# Patient Record
Sex: Male | Born: 1990 | Race: Black or African American | Hispanic: No | Marital: Single | Smoking: Never smoker
Health system: Southern US, Community
[De-identification: ages and names within clinical notes are randomized; demographics above are authoritative.]

---

## 2014-03-23 ENCOUNTER — Encounter (HOSPITAL_BASED_OUTPATIENT_CLINIC_OR_DEPARTMENT_OTHER): Payer: Self-pay | Admitting: Emergency Medicine

## 2014-03-23 DIAGNOSIS — N4889 Other specified disorders of penis: Secondary | ICD-10-CM | POA: Insufficient documentation

## 2014-03-23 LAB — URINALYSIS, ROUTINE W REFLEX MICROSCOPIC
BILIRUBIN URINE: NEGATIVE
Glucose, UA: 100 mg/dL — AB
Hgb urine dipstick: NEGATIVE
Ketones, ur: NEGATIVE mg/dL
LEUKOCYTES UA: NEGATIVE
NITRITE: NEGATIVE
PH: 6 (ref 5.0–8.0)
Protein, ur: NEGATIVE mg/dL
SPECIFIC GRAVITY, URINE: 1.025 (ref 1.005–1.030)
Urobilinogen, UA: 1 mg/dL (ref 0.0–1.0)

## 2014-03-23 NOTE — ED Notes (Signed)
Pt c/o painful urination denies discharge

## 2014-03-24 ENCOUNTER — Emergency Department (HOSPITAL_BASED_OUTPATIENT_CLINIC_OR_DEPARTMENT_OTHER)
Admission: EM | Admit: 2014-03-24 | Discharge: 2014-03-24 | Disposition: A | Discharge status: Home / Self Care | Payer: BC Managed Care – PPO | Attending: Emergency Medicine | Admitting: Emergency Medicine

## 2014-03-24 DIAGNOSIS — N4889 Other specified disorders of penis: Secondary | ICD-10-CM

## 2014-03-24 MED ORDER — AZITHROMYCIN 1 G PO PACK
PACK | ORAL | Status: AC
  Filled 2014-03-24: qty 1

## 2014-03-24 MED ORDER — LIDOCAINE HCL (PF) 1 % IJ SOLN
INTRAMUSCULAR | Status: AC
  Filled 2014-03-24: qty 5

## 2014-03-24 MED ORDER — CEFTRIAXONE SODIUM 250 MG IJ SOLR
250.0000 mg | Freq: Once | INTRAMUSCULAR | Status: AC
  Administered 2014-03-24: 250 mg via INTRAMUSCULAR
  Filled 2014-03-24: qty 250

## 2014-03-24 MED ORDER — AZITHROMYCIN 250 MG PO TABS
1000.0000 mg | ORAL_TABLET | Freq: Once | ORAL | Status: AC
  Administered 2014-03-24: 1000 mg via ORAL
  Filled 2014-03-24: qty 4

## 2014-03-24 NOTE — ED Provider Notes (Signed)
CSN: [id number]     Arrival date & time 03/23/14  2319 History  This chart was scribed for Glynn OctaveStephen Shunna Mikaelian, MD by Ardelia Memsylan Malpass, ED Scribe. This patient was seen in room MH09/MH09 and the patient's care was started at 1:00 AM.   Chief Complaint  Patient presents with  . Dysuria    The history is provided by the patient. No language interpreter was used.    HPI Comments: Andrew Padilla is a 23 y.o. male with no chronic medical conditions who presents to the Emergency Department complaining of "burning" dysuria onset 1 week ago. He states that he has also noticed "bumps" on his distal penis over the past 2 weeks. He denies any itching or pain to the bumps. He states that he had testicular pain 1 day last month, and that this resolved. He states that he has not tried any medications for his symptoms. He states that he has had similar symptoms in the past, and that after being treated empirically for chlamydia and gonorrhea, his symptoms resolved. He states that he is sexually active with his fiancee and that he does not use protection. He denies penile discharge, abdominal pain, back pain, testicular pain or any other pain or symptoms.    History reviewed. No pertinent past medical history. History reviewed. No pertinent past surgical history. History reviewed. No pertinent family history. History  Substance Use Topics  . Smoking status: Never Smoker   . Smokeless tobacco: Not on file  . Alcohol Use: No    Review of Systems A complete 10 system review of systems was obtained and all systems are negative except as noted in the HPI and PMH.   Allergies  Review of patient's allergies indicates no known allergies.  Home Medications   Prior to Admission medications   Not on File   Triage Vitals: BP 152/80  Pulse 106  Temp(Src) 98.2 F (36.8 C) (Oral)  Resp 18  Ht 6\' 2"  (1.88 m)  Wt 203 lb (92.08 kg)  BMI 26.05 kg/m2  SpO2 100%  Physical Exam  Nursing note and vitals  reviewed. Constitutional: He is oriented to person, place, and time. He appears well-developed and well-nourished. No distress.  HENT:  Head: Normocephalic and atraumatic.  Mouth/Throat: Oropharynx is clear and moist. No oropharyngeal exudate.  Eyes: Conjunctivae and EOM are normal. Pupils are equal, round, and reactive to light.  Neck: Neck supple. No tracheal deviation present.  Cardiovascular: Normal rate and normal heart sounds.   No murmur heard. Pulmonary/Chest: Effort normal. No respiratory distress.  Abdominal: Soft. There is no tenderness. There is no rebound and no guarding.  Genitourinary: No penile tenderness.  Testicles non-tender. Many flesh-colored papules covering glans and shaft. No warts. No open wounds. No vesicles.  Musculoskeletal: Normal range of motion. He exhibits no edema and no tenderness.  Neurological: He is alert and oriented to person, place, and time. No cranial nerve deficit. He exhibits normal muscle tone. Coordination normal.  Skin: Skin is warm and dry.  Psychiatric: He has a normal mood and affect. His behavior is normal.    ED Course  Procedures (including critical care time)  DIAGNOSTIC STUDIES: Oxygen Saturation is 100% on RA, normal by my interpretation.    COORDINATION OF CARE: 1:06 AM- Will order Rocephin and Zithromax. Discussed normal UA findings. Pt advised of plan for treatment and pt agrees.  Labs Review Labs Reviewed  URINALYSIS, ROUTINE W REFLEX MICROSCOPIC - Abnormal; Notable for the following:    Glucose, UA 100 (*)  All other components within normal limits   Imaging Review No results found.   EKG Interpretation None      MDM   Final diagnoses:  Penile papules   Burning with urination associated with painless papules to penile shaft and glans. No testicular pain. No discharge. No abdominal pain, fever vomiting.  Exam benign. No testicular tenderness. No vesicles or warts. UA negative.  Will treat empirically for  GC and chlamydia. No evidence of genital warts. Suspect pearly penile papules versus molluscum contagiosum. Discussed benign etiology of condition with patient. Will refer to dermatology. Safe sex practices d/w patient and fiancee.   I personally performed the services described in this documentation, which was scribed in my presence. The recorded information has been reviewed and is accurate.   Glynn OctaveStephen Giovannina Mun, MD 03/24/14 [phone]

## 2014-03-24 NOTE — Discharge Instructions (Signed)
Rash Follow up with the dermatologist. Return to the ED if you develop new or worsening sym[ptoms. A rash is a change in the color or texture of your skin. There are many different types of rashes. You may have other problems that accompany your rash. CAUSES   Infections.  Allergic reactions. This can include allergies to pets or foods.  Certain medicines.  Exposure to certain chemicals, soaps, or cosmetics.  Heat.  Exposure to poisonous plants.  Tumors, both cancerous and noncancerous. SYMPTOMS   Redness.  Scaly skin.  Itchy skin.  Dry or cracked skin.  Bumps.  Blisters.  Pain. DIAGNOSIS  Your caregiver may do a physical exam to determine what type of rash you have. A skin sample (biopsy) may be taken and examined under a microscope. TREATMENT  Treatment depends on the type of rash you have. Your caregiver may prescribe certain medicines. For serious conditions, you may need to see a skin doctor (dermatologist). HOME CARE INSTRUCTIONS   Avoid the substance that caused your rash.  Do not scratch your rash. This can cause infection.  You may take cool baths to help stop itching.  Only take over-the-counter or prescription medicines as directed by your caregiver.  Keep all follow-up appointments as directed by your caregiver. SEEK IMMEDIATE MEDICAL CARE IF:  You have increasing pain, swelling, or redness.  You have a fever.  You have new or severe symptoms.  You have body aches, diarrhea, or vomiting.  Your rash is not better after 3 days. MAKE SURE YOU:  Understand these instructions.  Will watch your condition.  Will get help right away if you are not doing well or get worse. Document Released: 10/27/2002 Document Revised: 01/29/2012 Document Reviewed: 08/21/2011 [facility] Patient Information 2014 [place], [place].

## 2014-06-27 ENCOUNTER — Encounter (HOSPITAL_BASED_OUTPATIENT_CLINIC_OR_DEPARTMENT_OTHER): Payer: Self-pay | Admitting: Emergency Medicine

## 2014-06-27 ENCOUNTER — Emergency Department (HOSPITAL_BASED_OUTPATIENT_CLINIC_OR_DEPARTMENT_OTHER)
Admission: EM | Admit: 2014-06-27 | Discharge: 2014-06-28 | Disposition: A | Discharge status: Home / Self Care | Payer: BC Managed Care – PPO | Attending: Emergency Medicine | Admitting: Emergency Medicine

## 2014-06-27 DIAGNOSIS — N4889 Other specified disorders of penis: Secondary | ICD-10-CM

## 2014-06-27 DIAGNOSIS — R300 Dysuria: Secondary | ICD-10-CM

## 2014-06-27 DIAGNOSIS — R21 Rash and other nonspecific skin eruption: Secondary | ICD-10-CM | POA: Insufficient documentation

## 2014-06-27 DIAGNOSIS — N410 Acute prostatitis: Secondary | ICD-10-CM

## 2014-06-27 LAB — URINALYSIS, ROUTINE W REFLEX MICROSCOPIC
Bilirubin Urine: NEGATIVE
Glucose, UA: NEGATIVE mg/dL
Hgb urine dipstick: NEGATIVE
Ketones, ur: NEGATIVE mg/dL
Leukocytes, UA: NEGATIVE
Nitrite: NEGATIVE
Protein, ur: NEGATIVE mg/dL
Specific Gravity, Urine: 1.019 (ref 1.005–1.030)
Urobilinogen, UA: 1 mg/dL (ref 0.0–1.0)
pH: 7.5 (ref 5.0–8.0)

## 2014-06-27 MED ORDER — CIPROFLOXACIN HCL 500 MG PO TABS: 500.0000 mg | ORAL_TABLET | Freq: Two times a day (BID) | ORAL | Status: DC

## 2014-06-27 MED ORDER — CIPROFLOXACIN HCL 500 MG PO TABS
500.0000 mg | ORAL_TABLET | Freq: Two times a day (BID) | ORAL | Status: DC
  Administered 2014-06-27: 500 mg via ORAL
  Filled 2014-06-27: qty 1

## 2014-06-27 NOTE — Discharge Instructions (Signed)
Dysuria Dysuria is the medical term for pain with urination. There are many causes for dysuria, but urinary tract infection is the most common. If a urinalysis was performed it can show that there is a urinary tract infection. A urine culture confirms that you or your child is sick. You will need to follow up with a healthcare provider because:  If a urine culture was done you will need to know the culture results and treatment recommendations.  If the urine culture was positive, you or your child will need to be put on antibiotics or know if the antibiotics prescribed are the right antibiotics for your urinary tract infection.  If the urine culture is negative (no urinary tract infection), then other causes may need to be explored or antibiotics need to be stopped. Today laboratory work may have been done and there does not seem to be an infection. If cultures were done they will take at least 24 to 48 hours to be completed. Today x-rays may have been taken and they read as normal. No cause can be found for the problems. The x-rays may be re-read by a radiologist and you will be contacted if additional findings are made. You or your child may have been put on medications to help with this problem until you can see your primary caregiver. If the problems get better, see your primary caregiver if the problems return. If you were given antibiotics (medications which kill germs), take all of the mediations as directed for the full course of treatment.  If laboratory work was done, you need to find the results. Leave a telephone number where you can be reached. If this is not possible, make sure you find out how you are to get test results. HOME CARE INSTRUCTIONS   Drink lots of fluids. For adults, drink eight, 8 ounce glasses of clear juice or water a day. For children, replace fluids as suggested by your caregiver.  Empty the bladder often. Avoid holding urine for long periods of time.  After a bowel  movement, women should cleanse front to back, using each tissue only once.  Empty your bladder before and after sexual intercourse.  Take all the medicine given to you until it is gone. You may feel better in a few days, but TAKE ALL MEDICINE.  Avoid caffeine, tea, alcohol and carbonated beverages, because they tend to irritate the bladder.  In men, alcohol may irritate the prostate.  Only take over-the-counter or prescription medicines for pain, discomfort, or fever as directed by your caregiver.  If your caregiver has given you a follow-up appointment, it is very important to keep that appointment. Not keeping the appointment could result in a chronic or permanent injury, pain, and disability. If there is any problem keeping the appointment, you must call back to this facility for assistance. SEEK IMMEDIATE MEDICAL CARE IF:   Back pain develops.  A fever develops.  There is nausea (feeling sick to your stomach) or vomiting (throwing up).  Problems are no better with medications or are getting worse. MAKE SURE YOU:   Understand these instructions.  Will watch your condition.  Will get help right away if you are not doing well or get worse. Document Released: 08/04/2004 Document Revised: 01/29/2012 Document Reviewed: 06/11/2008 [facility] Patient Information 2015 [place], [place]. This information is not intended to replace advice given to you by your health care provider. Make sure you discuss any questions you have with your health care provider.  Prostatitis The prostate  gland is about the size and shape of a walnut. It is located just below your bladder. It produces one of the components of semen, which is made up of sperm and the fluids that help nourish and transport it out from the testicles. Prostatitis is inflammation of the prostate gland.  There are four types of prostatitis:  Acute bacterial prostatitis. This is the least common type of prostatitis. It starts quickly and  usually is associated with a bladder infection, high fever, and shaking chills. It can occur at any age.  Chronic bacterial prostatitis. This is a persistent bacterial infection in the prostate. It usually develops from repeated acute bacterial prostatitis or acute bacterial prostatitis that was not properly treated. It can occur in men of any age but is most common in middle-aged men whose prostate has begun to enlarge. The symptoms are not as severe as those in acute bacterial prostatitis. Discomfort in the part of your body that is in front of your rectum and below your scrotum (perineum), lower abdomen, or in the head of your penis (glans) may represent your primary discomfort.  Chronic prostatitis (nonbacterial). This is the most common type of prostatitis. It is inflammation of the prostate gland that is not caused by a bacterial infection. The cause is unknown and may be associated with a viral infection or autoimmune disorder.  Prostatodynia (pelvic floor disorder). This is associated with increased muscular tone in the pelvis surrounding the prostate. CAUSES The causes of bacterial prostatitis are bacterial infection. The causes of the other types of prostatitis are unknown.  SYMPTOMS  Symptoms can vary depending upon the type of prostatitis that exists. There can also be overlap in symptoms. Possible symptoms for each type of prostatitis are listed below. Acute Bacterial Prostatitis  Painful urination.  Fever or chills.  Muscle or joint pains.  Low back pain.  Low abdominal pain.  Inability to empty bladder completely. Chronic Bacterial Prostatitis, Chronic Nonbacterial Prostatitis, and Prostatodynia  Sudden urge to urinate.  Frequent urination.  Difficulty starting urine stream.  Weak urine stream.  Discharge from the urethra.  Dribbling after urination.  Rectal pain.  Pain in the testicles, penis, or tip of the penis.  Pain in the perineum.  Problems with  sexual function.  Painful ejaculation.  Bloody semen. DIAGNOSIS  In order to diagnose prostatitis, your health care provider will ask about your symptoms. One or more urine samples will be taken and tested (urinalysis). If the urinalysis result is negative for bacteria, your health care provider may use a finger to feel your prostate (digital rectal exam). This exam helps your health care provider determine if your prostate is swollen and tender. It will also produce a specimen of semen that can be analyzed. TREATMENT  Treatment for prostatitis depends on the cause. If a bacterial infection is the cause, it can be treated with antibiotic medicine. In cases of chronic bacterial prostatitis, the use of antibiotics for up to 1 month or 6 weeks may be necessary. Your health care provider may instruct you to take sitz baths to help relieve pain. A sitz bath is a bath of hot water in which your hips and buttocks are under water. This relaxes the pelvic floor muscles and often helps to relieve the pressure on your prostate. HOME CARE INSTRUCTIONS   Take all medicines as directed by your health care provider.  Take sitz baths as directed by your health care provider. SEEK MEDICAL CARE IF:   Your symptoms get worse,  not better.  You have a fever. SEEK IMMEDIATE MEDICAL CARE IF:   You have chills.  You feel nauseous or vomit.  You feel lightheaded or faint.  You are unable to urinate.  You have blood or blood clots in your urine. MAKE SURE YOU:  Understand these instructions.  Will watch your condition.  Will get help right away if you are not doing well or get worse. Document Released: 11/03/2000 Document Revised: 11/11/2013 Document Reviewed: 05/26/2013 [facility] Patient Information 2015 [place], [place]. This information is not intended to replace advice given to you by your health care provider. Make sure you discuss any questions you have with your health care provider.   Molluscum  Contagiosum Molluscum contagiosum is a viral infection of the skin that causes smooth surfaced, firm, small (3 to 5 mm), dome-shaped bumps (papules) which are flesh-colored. The bumps usually do not hurt or itch. In children, they most often appear on the face, trunk, arms and legs. In adults, the growths are commonly found on the genitals, thighs, face, neck, and belly (abdomen). The infection may be spread to others by close (skin to skin) contact (such as occurs in schools and swimming pools), sharing towels and clothing, and through sexual contact. The bumps usually disappear without treatment in 2 to 4 months, especially in children. You may have them treated to avoid spreading them. Scraping (curetting) the middle part (central plug) of the bump with a needle or sharp curette, or application of liquid nitrogen for 8 or 9 seconds usually cures the infection. HOME CARE INSTRUCTIONS   Do not scratch the bumps. This may spread the infection to other parts of the body and to other people.  Avoid close contact with others, including sexual contact, until the bumps disappear. Do not share towels or clothing.  If liquid nitrogen was used, blisters will form. Leave the blisters alone and cover with a bandage. The tops will fall off by themselves in 7 to 14 days.  Four months without a lesion is usually a cure. SEEK IMMEDIATE MEDICAL CARE IF:  You have a fever.  You develop swelling, redness, pain, tenderness, or warmth in the areas of the bumps. They may be infected. Document Released: 11/03/2000 Document Revised: 01/29/2012 Document Reviewed: 04/16/2009 [facility] Patient Information 2015 [place], [place]. This information is not intended to replace advice given to you by your health care provider. Make sure you discuss any questions you have with your health care provider.

## 2014-06-27 NOTE — ED Notes (Addendum)
Pt reports off and on bladder pressure and frequency for one month; pt also has a rash on his penis.

## 2014-06-27 NOTE — ED Provider Notes (Signed)
CSN: [id number]     Arrival date & time 06/27/14  1940 History  This chart was scribed for Andrew Cookey, MD by Elon Spanner, ED Scribe. This patient was seen in room MH10/MH10 and the patient's care was started at 9:05 PM.    Chief Complaint  Patient presents with  . Dysuria  . Rash    Patient is a 23 y.o. male presenting with dysuria and rash. The history is provided by the patient. No language interpreter was used.  Dysuria This is a new problem. The current episode started yesterday. The problem occurs constantly. Associated symptoms include abdominal pain. Pertinent negatives include no chest pain, no headaches and no shortness of breath. Nothing aggravates the symptoms. Nothing relieves the symptoms. He has tried nothing for the symptoms.  Rash Location:  Ano-genital Ano-genital rash location:  Penis Severity:  Mild Onset quality:  Gradual Duration:  3 months Timing:  Constant Progression:  Unchanged Chronicity:  Chronic Worsened by:  Nothing tried Associated symptoms: abdominal pain, diarrhea and fever   Associated symptoms: no fatigue, no headaches, no nausea, no shortness of breath and not vomiting     HPI Comments: Andrew Padilla is a 23 y.o. male who presents to the Emergency Department complaining of moderate dysuria that he has experienced for approximately 1 month.  He reports associated increases in urinary frequency and increased urgency but denies difficulty emptying his bladder fully.  Per chart review, the patient was seen on 5/5 for a complaint of "burning" dysuria and diagnosed with penile papules and treated empirically for GC and chlamydia.  The patient also states he has had associated abdominal pain onset last night.  He states that the severity of the pain oscillates but that he constantly notices a sensation of "bloating".  Patient states that food and fluids do not exacerbate the pain but water intake increases his sensation of bloating.  Patient states had an  episode of diarrhea approximately 1 week ago.  He states that he took a laxative earlier today.  Patient states that he has had a subjective fever within the last several days that he describes as "heat around his ears".  Patient denies new sexual partners and states that he uses a condom sometimes.   Patient denies hematuria, nausea, vomiting, painful BM, sick contacts, penile discharge.  Patient denies a history of bladder infection, kidney stone.     Took laxative today History reviewed. No pertinent past medical history. History reviewed. No pertinent past surgical history. History reviewed. No pertinent family history. History  Substance Use Topics  . Smoking status: Never Smoker   . Smokeless tobacco: Not on file  . Alcohol Use: No    Review of Systems  Constitutional: Positive for fever. Negative for activity change, appetite change and fatigue.  HENT: Negative for congestion, facial swelling, rhinorrhea and trouble swallowing.   Eyes: Negative for photophobia and pain.  Respiratory: Negative for cough, chest tightness and shortness of breath.   Cardiovascular: Negative for chest pain and leg swelling.  Gastrointestinal: Positive for abdominal pain, diarrhea and abdominal distention. Negative for nausea, vomiting and constipation.  Endocrine: Negative for polydipsia and polyuria.  Genitourinary: Positive for dysuria, urgency and frequency. Negative for hematuria, decreased urine volume, discharge and difficulty urinating.  Musculoskeletal: Negative for back pain and gait problem.  Skin: Positive for rash. Negative for color change and wound.  Allergic/Immunologic: Negative for immunocompromised state.  Neurological: Negative for dizziness, facial asymmetry, speech difficulty, weakness, numbness and headaches.  Psychiatric/Behavioral: Negative for  confusion, decreased concentration and agitation.      Allergies  Review of patient's allergies indicates no known allergies.  Home  Medications   Prior to Admission medications   Medication Sig Start Date End Date Taking? Authorizing Provider  ciprofloxacin (CIPRO) 500 MG tablet Take 1 tablet (500 mg total) by mouth 2 (two) times daily. One po bid x 21 days 06/27/14   Andrew CookeyMegan Firman Petrow, MD   BP 155/65  Pulse 92  Temp(Src) 98.8 F (37.1 C) (Oral)  Resp 16  Wt 205 lb (92.987 kg)  SpO2 100% Physical Exam  Nursing note and vitals reviewed. Constitutional: He is oriented to person, place, and time. He appears well-developed and well-nourished. No distress.  HENT:  Head: Normocephalic and atraumatic.  Mouth/Throat: No oropharyngeal exudate.  Eyes: Pupils are equal, round, and reactive to light.  Neck: Normal range of motion. Neck supple.  Cardiovascular: Normal rate, regular rhythm and normal heart sounds.  Exam reveals no gallop and no friction rub.   No murmur heard. Pulmonary/Chest: Effort normal and breath sounds normal. No respiratory distress. He has no wheezes. He has no rales.  Abdominal: Soft. Bowel sounds are normal. He exhibits no distension and no mass. There is no tenderness. There is no rebound and no guarding.  Genitourinary:  Prostate non-tender.  Multiple raised, white, non-tender papules on penile shaft and glans.  Musculoskeletal: Normal range of motion. He exhibits no edema and no tenderness.  Neurological: He is alert and oriented to person, place, and time.  Skin: Skin is warm and dry.  Psychiatric: He has a normal mood and affect.    ED Course  Procedures (including critical care time)  DIAGNOSTIC STUDIES: Oxygen Saturation is 100% on RA, normal by my interpretation.    COORDINATION OF CARE:  9:11 PM Discussed plan to order labs and perform rectal exam.  Patient acknowledges and agrees with plan.    Labs Review Labs Reviewed  URINE CULTURE  GC/CHLAMYDIA PROBE AMP  URINALYSIS, ROUTINE W REFLEX MICROSCOPIC    Imaging Review No results found.   EKG Interpretation None      MDM    Final diagnoses:  Dysuria  Acute prostatitis  Pearly penile papules    Pt is a 23 y.o. male with Pmhx as above who presents with recurrent dysuria, frequency and bladder pressure. Denies fever, painful BMs, penile d/c, hematuria. He also reports "rash" on penis for "months" which is non-tenderness and unchanged. He has had some abdominal cramping today after taking a laxative. He was treated for GC/Chlam at last ED visit. On PE, VSS, pt in NAD. +suprapubic ttp. Prostate exam and UA nml; however, given continued urinary symptoms, will treat presumtively for prostatitis with 3 weeks of cipro.  "Rash" is c/w either pearly penile papules or molluscum contagiosum, in agreement with last exam by Dr. Manus Gunningancour. He will again be asked to f/u with derm. If urinary symptoms continue, I have rec he see a urologist. Return precautions given for new or worsening symptoms including worsening pain, fever, inability to tolerate liquids.       I personally performed the services described in this documentation, which was scribed in my presence. The recorded information has been reviewed and is accurate.     Andrew CookeyMegan Tynslee Bowlds, MD 06/28/14 [phone]

## 2014-06-29 LAB — URINE CULTURE
Colony Count: NO GROWTH
Culture: NO GROWTH

## 2014-06-30 LAB — GC/CHLAMYDIA PROBE AMP
CT Probe RNA: NEGATIVE
GC PROBE AMP APTIMA: NEGATIVE

## 2014-07-02 ENCOUNTER — Emergency Department (HOSPITAL_BASED_OUTPATIENT_CLINIC_OR_DEPARTMENT_OTHER)
Admission: EM | Admit: 2014-07-02 | Discharge: 2014-07-03 | Disposition: A | Discharge status: Home / Self Care | Payer: BC Managed Care – PPO | Attending: Emergency Medicine | Admitting: Emergency Medicine

## 2014-07-02 ENCOUNTER — Encounter (HOSPITAL_BASED_OUTPATIENT_CLINIC_OR_DEPARTMENT_OTHER): Payer: Self-pay | Admitting: Emergency Medicine

## 2014-07-02 DIAGNOSIS — R300 Dysuria: Secondary | ICD-10-CM | POA: Insufficient documentation

## 2014-07-02 DIAGNOSIS — N489 Disorder of penis, unspecified: Secondary | ICD-10-CM | POA: Insufficient documentation

## 2014-07-02 DIAGNOSIS — Z792 Long term (current) use of antibiotics: Secondary | ICD-10-CM | POA: Insufficient documentation

## 2014-07-02 NOTE — ED Notes (Signed)
Pt c/o penile pain and burning when he urinates. Pt seen here recently for the same complaint.

## 2014-07-03 LAB — URINALYSIS, ROUTINE W REFLEX MICROSCOPIC
Bilirubin Urine: NEGATIVE
Glucose, UA: NEGATIVE mg/dL
Hgb urine dipstick: NEGATIVE
Ketones, ur: NEGATIVE mg/dL
LEUKOCYTES UA: NEGATIVE
NITRITE: NEGATIVE
Protein, ur: NEGATIVE mg/dL
Specific Gravity, Urine: 1.035 — ABNORMAL HIGH (ref 1.005–1.030)
Urobilinogen, UA: 1 mg/dL (ref 0.0–1.0)
pH: 6 (ref 5.0–8.0)

## 2014-07-03 MED ORDER — AZITHROMYCIN 250 MG PO TABS
1000.0000 mg | ORAL_TABLET | Freq: Once | ORAL | Status: DC
  Filled 2014-07-03: qty 4

## 2014-07-03 MED ORDER — AZITHROMYCIN 1 G PO PACK
1.0000 g | PACK | Freq: Once | ORAL | Status: AC
  Administered 2014-07-03: 1 g via ORAL
  Filled 2014-07-03: qty 1

## 2014-07-03 MED ORDER — CEFTRIAXONE SODIUM 250 MG IJ SOLR
250.0000 mg | Freq: Once | INTRAMUSCULAR | Status: AC
  Administered 2014-07-03: 250 mg via INTRAMUSCULAR
  Filled 2014-07-03: qty 250

## 2014-07-03 NOTE — ED Provider Notes (Signed)
CSN: [id number]     Arrival date & time 07/02/14  2311 History   First MD Initiated Contact with Patient 07/03/14 0023     Chief Complaint  Patient presents with  . Penis Pain     (Consider location/radiation/quality/duration/timing/severity/associated sxs/prior Treatment) HPI Comments: Patient is a 23 year old male who presents with complaints of dysuria. He states that it burns and the tip of his penis when he urinates. He was seen 2 months ago and diagnosed with an STD. He was treated with antibiotics. He was seen here 5 days ago for similar complaints. His GC and Chlamydia was negative and he was diagnosed with prostatitis. He is currently taking Cipro.  Patient is a 23 y.o. male presenting with penile pain. The history is provided by the patient.  Penis Pain This is a recurrent problem. The current episode started yesterday. The problem occurs constantly. The problem has been gradually worsening. Pertinent negatives include no abdominal pain. Nothing aggravates the symptoms. Nothing relieves the symptoms. He has tried nothing for the symptoms. The treatment provided no relief.    History reviewed. No pertinent past medical history. History reviewed. No pertinent past surgical history. No family history on file. History  Substance Use Topics  . Smoking status: Never Smoker   . Smokeless tobacco: Not on file  . Alcohol Use: No    Review of Systems  Gastrointestinal: Negative for abdominal pain.  Genitourinary: Positive for penile pain.  All other systems reviewed and are negative.     Allergies  Review of patient's allergies indicates no known allergies.  Home Medications   Prior to Admission medications   Medication Sig Start Date End Date Taking? Authorizing Provider  ciprofloxacin (CIPRO) 500 MG tablet Take 1 tablet (500 mg total) by mouth 2 (two) times daily. One po bid x 21 days 06/27/14   Toy CookeyMegan Docherty, MD   BP 150/92  Pulse 76  Temp(Src) 98.2 F (36.8 C) (Oral)   Resp 18  Wt 200 lb (90.719 kg)  SpO2 98% Physical Exam  Nursing note and vitals reviewed. Constitutional: He is oriented to person, place, and time. He appears well-developed and well-nourished. No distress.  HENT:  Head: Normocephalic and atraumatic.  Neck: Normal range of motion. Neck supple.  Genitourinary:  There are multiple small papules noted to the glans and shaft of the penis. These appear to be molluscum contagiosum. There is no obvious urethral discharge and no vesicular lesions.  Neurological: He is alert and oriented to person, place, and time.  Skin: Skin is warm and dry. He is not diaphoretic.    ED Course  Procedures (including critical care time) Labs Review Labs Reviewed  GC/CHLAMYDIA PROBE AMP  URINALYSIS, ROUTINE W REFLEX MICROSCOPIC    Imaging Review No results found.   EKG Interpretation None      MDM   Final diagnoses:  None    Urinalysis is clear. GC and Chlamydia swabs are pending. We'll treat presumptively with Zithromax and Rocephin. To return as needed.    Geoffery Lyonsouglas Tamyrah Burbage, MD 07/03/14 [phone]

## 2014-07-03 NOTE — Discharge Instructions (Signed)
We will call you if your cultures indicate that you require further treatment.  Followup with dermatology as previously recommended.   Dysuria Dysuria is the medical term for pain with urination. There are many causes for dysuria, but urinary tract infection is the most common. If a urinalysis was performed it can show that there is a urinary tract infection. A urine culture confirms that you or your child is sick. You will need to follow up with a healthcare provider because:  If a urine culture was done you will need to know the culture results and treatment recommendations.  If the urine culture was positive, you or your child will need to be put on antibiotics or know if the antibiotics prescribed are the right antibiotics for your urinary tract infection.  If the urine culture is negative (no urinary tract infection), then other causes may need to be explored or antibiotics need to be stopped. Today laboratory work may have been done and there does not seem to be an infection. If cultures were done they will take at least 24 to 48 hours to be completed. Today x-rays may have been taken and they read as normal. No cause can be found for the problems. The x-rays may be re-read by a radiologist and you will be contacted if additional findings are made. You or your child may have been put on medications to help with this problem until you can see your primary caregiver. If the problems get better, see your primary caregiver if the problems return. If you were given antibiotics (medications which kill germs), take all of the mediations as directed for the full course of treatment.  If laboratory work was done, you need to find the results. Leave a telephone number where you can be reached. If this is not possible, make sure you find out how you are to get test results. HOME CARE INSTRUCTIONS   Drink lots of fluids. For adults, drink eight, 8 ounce glasses of clear juice or water a day. For  children, replace fluids as suggested by your caregiver.  Empty the bladder often. Avoid holding urine for long periods of time.  After a bowel movement, women should cleanse front to back, using each tissue only once.  Empty your bladder before and after sexual intercourse.  Take all the medicine given to you until it is gone. You may feel better in a few days, but TAKE ALL MEDICINE.  Avoid caffeine, tea, alcohol and carbonated beverages, because they tend to irritate the bladder.  In men, alcohol may irritate the prostate.  Only take over-the-counter or prescription medicines for pain, discomfort, or fever as directed by your caregiver.  If your caregiver has given you a follow-up appointment, it is very important to keep that appointment. Not keeping the appointment could result in a chronic or permanent injury, pain, and disability. If there is any problem keeping the appointment, you must call back to this facility for assistance. SEEK IMMEDIATE MEDICAL CARE IF:   Back pain develops.  A fever develops.  There is nausea (feeling sick to your stomach) or vomiting (throwing up).  Problems are no better with medications or are getting worse. MAKE SURE YOU:   Understand these instructions.  Will watch your condition.  Will get help right away if you are not doing well or get worse. Document Released: 08/04/2004 Document Revised: 01/29/2012 Document Reviewed: 06/11/2008 [facility] Patient Information 2015 [employer] [place]. This information is not intended to replace advice given to you  by your health care provider. Make sure you discuss any questions you have with your health care provider. ° °

## 2014-07-03 NOTE — ED Notes (Signed)
MD at bedside. 

## 2014-07-04 LAB — GC/CHLAMYDIA PROBE AMP
CT PROBE, AMP APTIMA: NEGATIVE
GC Probe RNA: NEGATIVE

## 2014-07-21 ENCOUNTER — Ambulatory Visit: Payer: BC Managed Care – PPO | Admitting: Physician Assistant

## 2014-07-21 DIAGNOSIS — Z0289 Encounter for other administrative examinations: Secondary | ICD-10-CM

## 2014-11-28 ENCOUNTER — Encounter (HOSPITAL_BASED_OUTPATIENT_CLINIC_OR_DEPARTMENT_OTHER): Payer: Self-pay | Admitting: Emergency Medicine

## 2014-11-28 ENCOUNTER — Emergency Department (HOSPITAL_BASED_OUTPATIENT_CLINIC_OR_DEPARTMENT_OTHER)
Admission: EM | Admit: 2014-11-28 | Discharge: 2014-11-29 | Disposition: A | Discharge status: Home / Self Care | Payer: BLUE CROSS/BLUE SHIELD | Attending: Emergency Medicine | Admitting: Emergency Medicine

## 2014-11-28 DIAGNOSIS — Z9889 Other specified postprocedural states: Secondary | ICD-10-CM | POA: Diagnosis not present

## 2014-11-28 DIAGNOSIS — N4889 Other specified disorders of penis: Secondary | ICD-10-CM

## 2014-11-28 NOTE — ED Provider Notes (Signed)
CSN: [id number]     Arrival date & time 11/28/14  2116 History  This chart was scribe for No att. providers found by Angelene GiovanniEmmanuella Mensah, ED Scribe. The patient was seen in room MH09/MH09 and the patient's care was started at 11:41 PM.    No chief complaint on file.  The history is provided by the patient. No language interpreter was used.   HPI Comments: Andrew Denseick Middlesworth is a 24 y.o. male who presents to the Emergency Department complaining of penile swelling onset today. He reports associated skin peeling on the penis. He reports that he went to the doctor and prescribed medication for bumps on his penis. He noticed his symptoms when he woke up this morning after he used his medication. He states that he has used the medication 3 times already. He denies a fever, dysuria, penile discharge, and burning while urinating. He reports that he had an STD in the past.   History reviewed. No pertinent past medical history. History reviewed. No pertinent past surgical history. No family history on file. History  Substance Use Topics  . Smoking status: Never Smoker   . Smokeless tobacco: Not on file  . Alcohol Use: No    Review of Systems  Constitutional: Negative for fever.  Genitourinary: Positive for penile swelling. Negative for dysuria and discharge.  Skin:       Skin peeling  All other systems reviewed and are negative.     Allergies  Review of patient's allergies indicates no known allergies.  Home Medications   Prior to Admission medications   Medication Sig Start Date End Date Taking? Authorizing Provider  ciprofloxacin (CIPRO) 500 MG tablet Take 1 tablet (500 mg total) by mouth 2 (two) times daily. One po bid x 21 days 06/27/14   Toy CookeyMegan Docherty, MD   BP 134/74 mmHg  Pulse 72  Temp(Src) 98.7 F (37.1 C) (Oral)  Resp 18  Ht 6\' 2"  (1.88 m)  Wt 207 lb (93.895 kg)  BMI 26.57 kg/m2  SpO2 99% Physical Exam  Constitutional: He is oriented to person, place, and time. He appears  well-developed and well-nourished. No distress.  HENT:  Head: Normocephalic and atraumatic.  Eyes: Conjunctivae and EOM are normal.  Neck: Neck supple. No tracheal deviation present.  Cardiovascular: Normal rate.   Pulmonary/Chest: Effort normal. No respiratory distress.  Genitourinary:  Circumcised Testicle is non tender and there is no discharge from the penis Mild swelling circumferential mid to center penis SOFT AND COMPRESSIBLE.  Multiple palpable to the dorsal aspect of the penis.    Musculoskeletal: Normal range of motion.  Neurological: He is alert and oriented to person, place, and time.  Skin: Skin is warm and dry.  Psychiatric: He has a normal mood and affect. His behavior is normal.  Nursing note and vitals reviewed.   ED Course  Procedures (including critical care time) DIAGNOSTIC STUDIES: Oxygen Saturation is 99% on RA, normal by my interpretation.    COORDINATION OF CARE: 11:49 PM- Pt advised of plan for treatment and pt agrees.    Labs Review Labs Reviewed - No data to display  Imaging Review No results found.   EKG Interpretation None      MDM   Final diagnoses:  Swelling, penis    I personally performed the services described in this documentation, which was scribed in my presence. The recorded information has been reviewed and is accurate.   Patient with mild swelling and dry skin of the shaft, swelling mild, no concern  for circulation. Discussed holding topical meds and fup with urology.  Results and differential diagnosis were discussed with the patient/parent/guardian. Close follow up outpatient was discussed, comfortable with the plan.   Medications - No data to display  Filed Vitals:   11/28/14 2132 11/29/14 0044  BP: 161/92 134/74  Pulse: 84 72  Temp: 98.6 F (37 C) 98.7 F (37.1 C)  TempSrc: Oral Oral  Resp: 20 18  Height:  (1.88 m)   Weight: 207 lb (93.895 kg)   SpO2: 99%     Final diagnoses:  Swelling, penis       Enid Skeens, MD 11/29/14 [phone]

## 2014-11-28 NOTE — ED Notes (Signed)
PT presents to ED with complaints of penis swelling since this morning.

## 2014-11-29 NOTE — Discharge Instructions (Signed)
Stop using topical medicines.  Discuss other options with primary doctor and urology. Return for fevers or worsening swelling.  If you were given medicines take as directed.  If you are on coumadin or contraceptives realize their levels and effectiveness is altered by many different medicines.  If you have any reaction (rash, tongues swelling, other) to the medicines stop taking and see a physician.   Have blood pressure rechecked with primary doctor.   Please follow up as directed and return to the ER or see a physician for new or worsening symptoms.  Thank you. Filed Vitals:   11/28/14 2132  BP: 161/92  Pulse: 84  Temp: 98.6 F (37 C)  TempSrc: Oral  Resp: 20  Height: 6\' 2"  (1.88 m)  Weight: 207 lb (93.895 kg)  SpO2: 99%

## 2016-04-03 ENCOUNTER — Encounter (HOSPITAL_BASED_OUTPATIENT_CLINIC_OR_DEPARTMENT_OTHER): Payer: Self-pay | Admitting: *Deleted

## 2016-04-03 ENCOUNTER — Emergency Department (HOSPITAL_BASED_OUTPATIENT_CLINIC_OR_DEPARTMENT_OTHER)
Admission: EM | Admit: 2016-04-03 | Discharge: 2016-04-03 | Disposition: A | Discharge status: Home / Self Care | Payer: BLUE CROSS/BLUE SHIELD | Attending: Emergency Medicine | Admitting: Emergency Medicine

## 2016-04-03 DIAGNOSIS — R1030 Lower abdominal pain, unspecified: Secondary | ICD-10-CM

## 2016-04-03 LAB — CBC WITH DIFFERENTIAL/PLATELET
Basophils Absolute: 0 10*3/uL (ref 0.0–0.1)
Basophils Relative: 1 %
EOS ABS: 0.1 10*3/uL (ref 0.0–0.7)
EOS PCT: 1 %
HCT: 44.7 % (ref 39.0–52.0)
Hemoglobin: 15.4 g/dL (ref 13.0–17.0)
LYMPHS ABS: 3.8 10*3/uL (ref 0.7–4.0)
LYMPHS PCT: 48 %
MCH: 31.9 pg (ref 26.0–34.0)
MCHC: 34.5 g/dL (ref 30.0–36.0)
MCV: 92.5 fL (ref 78.0–100.0)
Monocytes Absolute: 0.9 10*3/uL (ref 0.1–1.0)
Monocytes Relative: 11 %
Neutro Abs: 3.2 10*3/uL (ref 1.7–7.7)
Neutrophils Relative %: 39 %
PLATELETS: 297 10*3/uL (ref 150–400)
RBC: 4.83 MIL/uL (ref 4.22–5.81)
RDW: 13.3 % (ref 11.5–15.5)
WBC: 8.1 10*3/uL (ref 4.0–10.5)

## 2016-04-03 LAB — URINALYSIS, ROUTINE W REFLEX MICROSCOPIC
BILIRUBIN URINE: NEGATIVE
GLUCOSE, UA: NEGATIVE mg/dL
Hgb urine dipstick: NEGATIVE
Ketones, ur: NEGATIVE mg/dL
Leukocytes, UA: NEGATIVE
Nitrite: NEGATIVE
Protein, ur: NEGATIVE mg/dL
SPECIFIC GRAVITY, URINE: 1.02 (ref 1.005–1.030)
pH: 7 (ref 5.0–8.0)

## 2016-04-03 LAB — COMPREHENSIVE METABOLIC PANEL
ALT: 48 U/L (ref 17–63)
ANION GAP: 8 (ref 5–15)
AST: 27 U/L (ref 15–41)
Albumin: 4.2 g/dL (ref 3.5–5.0)
Alkaline Phosphatase: 67 U/L (ref 38–126)
BUN: 17 mg/dL (ref 6–20)
CHLORIDE: 104 mmol/L (ref 101–111)
CO2: 27 mmol/L (ref 22–32)
Calcium: 9.6 mg/dL (ref 8.9–10.3)
Creatinine, Ser: 1.05 mg/dL (ref 0.61–1.24)
Glucose, Bld: 96 mg/dL (ref 65–99)
POTASSIUM: 3.7 mmol/L (ref 3.5–5.1)
Sodium: 139 mmol/L (ref 135–145)
Total Bilirubin: 0.8 mg/dL (ref 0.3–1.2)
Total Protein: 7.1 g/dL (ref 6.5–8.1)

## 2016-04-03 LAB — LIPASE, BLOOD: Lipase: 34 U/L (ref 11–51)

## 2016-04-03 MED ORDER — ACETAMINOPHEN 325 MG PO TABS
650.0000 mg | ORAL_TABLET | Freq: Once | ORAL | Status: AC
  Administered 2016-04-03: 650 mg via ORAL
  Filled 2016-04-03: qty 2

## 2016-04-03 MED ORDER — SODIUM CHLORIDE 0.9 % IV BOLUS (SEPSIS)
1000.0000 mL | Freq: Once | INTRAVENOUS | Status: AC
  Administered 2016-04-03: 1000 mL via INTRAVENOUS

## 2016-04-03 MED ORDER — DICYCLOMINE HCL 10 MG PO CAPS
10.0000 mg | ORAL_CAPSULE | Freq: Once | ORAL | Status: AC
  Administered 2016-04-03: 10 mg via ORAL
  Filled 2016-04-03: qty 1

## 2016-04-03 MED ORDER — DICYCLOMINE HCL 20 MG PO TABS: 20.0000 mg | ORAL_TABLET | Freq: Two times a day (BID) | ORAL | Status: AC

## 2016-04-03 NOTE — Discharge Instructions (Signed)
Your tests today show no abnormality.   Take acetaminophen (Tylenol) up to 975 mg (this is normally 3 over-the-counter pills) up to 3 times a day. Do not drink alcohol. Make sure your other medications do not contain acetaminophen (Read the labels!)  Do not hesitate to return to the emergency room for any new, worsening or concerning symptoms.  Please obtain primary care using resource guide below. Let them know that you were seen in the emergency room and that they will need to obtain records for further outpatient management.    Abdominal Pain, Adult Many things can cause abdominal pain. Usually, abdominal pain is not caused by a disease and will improve without treatment. It can often be observed and treated at home. Your health care provider will do a physical exam and possibly order blood tests and X-rays to help determine the seriousness of your pain. However, in many cases, more time must pass before a clear cause of the pain can be found. Before that point, your health care provider may not know if you need more testing or further treatment. HOME CARE INSTRUCTIONS Monitor your abdominal pain for any changes. The following actions may help to alleviate any discomfort you are experiencing:  Only take over-the-counter or prescription medicines as directed by your health care provider.  Do not take laxatives unless directed to do so by your health care provider.  Try a clear liquid diet (broth, tea, or water) as directed by your health care provider. Slowly move to a bland diet as tolerated. SEEK MEDICAL CARE IF:  You have unexplained abdominal pain.  You have abdominal pain associated with nausea or diarrhea.  You have pain when you urinate or have a bowel movement.  You experience abdominal pain that wakes you in the night.  You have abdominal pain that is worsened or improved by eating food.  You have abdominal pain that is worsened with eating fatty foods.  You have a  fever. SEEK IMMEDIATE MEDICAL CARE IF:  Your pain does not go away within 2 hours.  You keep throwing up (vomiting).  Your pain is felt only in portions of the abdomen, such as the right side or the left lower portion of the abdomen.  You pass bloody or black tarry stools. MAKE SURE YOU:  Understand these instructions.  Will watch your condition.  Will get help right away if you are not doing well or get worse.   This information is not intended to replace advice given to you by your health care provider. Make sure you discuss any questions you have with your health care provider.   Document Released: 08/16/2005 Document Revised: 07/28/2015 Document Reviewed: 07/16/2013 Elsevier Interactive Patient Education 2016 [employer]. [employer] Assistance The [employer] 211 is a great source of information about community services available.  Access by dialing 2-1-1 from anywhere in [place], or by website -  PooledIncome.pl.   Other Local Resources (Updated 11/2015)  Financial Assistance   Services    Phone Number and Address  [facility]  Low-cost medical care - 1st and 3rd Saturday of every month  Must not qualify for public or private insurance and must have limited income [phone] [address] [place], [place]     [employer] of Social Services  Child care  Emergency assistance for housing and [employer]  Medicaid [phone] [address] [place], [place] [address]   [facility] Department  Low-cost medical care  for children, communicable diseases, sexually-transmitted diseases, immunizations, maternity care, womens health and family planning [phone] [address] [place], [place] [address]  [facility]   Medication assistance for [facility] residents  Must meet income requirements  [phone] [address] [place], [place].    [facility] Social Services  Child care  Emergency assistance for housing and [employer]  Medicaid [phone] [address] [place], [place] [address]  [facility]   Low-cost medical care,   Monday through Friday, 9 am to 6 pm.   Accepts Medicare/Medicaid, and self-pay [phone] [address] [place], [place] [address]  [facility] for [facility]  Low-cost medical care - Monday through Friday, 8:30 am - 5:30 pm  Accepts Medicaid and self-pay [phone] [address] [place], [place] [address]   [facility]  Primary medical care, including for those with sickle cell disease  Accepts Medicare, Medicaid, insurance and self-pay [phone] [address] [place], [place]  [facility]   Primary medical care  Accepts Medicare, [place], insurance and self-pay [phone] [address] [place], [place] [address]   [facility] Department of Social Services  Child care  Emergency assistance for housing and [employer]  Medicaid [phone] [address] [place], [place] [address]  [facility] Department of Health and [employer]  Child care  Emergency assistance for housing and [employer]  Medicaid [phone] [address] [place], [place] [address]   [facility] Assistance [facility]  Medication assistance for [facility] residents with no insurance only  Must have a primary care doctor [phone] [address] [place], [place]  [facility]   Primary medical care  [place], [place], insurance  [phone] [place], [place]  [facility]   Medication assistance [phone]  [facility]   Primary medical care  Accepts Medicare, [place],  insurance and self-pay [phone] [address] [place], [place] [address]  [facility]   Primary medical care  Accepts Medicare, [place], insurance and self-pay [phone] [address] [place], [place] [address]  [facility]  For [place] residents between the ages of 1 and 51 who do not have any form of health insurance, Medicare, [place], or [place] benefits.  Services are provided free of charge to uninsured patients who fall within federal poverty guidelines.    Hours: Tuesdays and Thursdays, 4:15 - 8 pm [phone] [address] [place], [place] [address]  [facility]     Primary medical care  Dental care  Nutritional counseling  Pharmacy  Accepts Medicaid, Medicare, most insurance.  Fees are adjusted based on ability to pay.   [phone] [facility] [address] [place], [place]  [phone] [facility] [address] [place], [place]  [phone] [facility] [place], [place]  [phone] [facility], [address] [place], [place]  [phone] [facility] [address] [place], [place]  [facility]  Womens health and family planning [phone] [place], [place]  [facility] Department of Social Services  Child care  Emergency assistance for housing and [employer]  Medicaid [phone] [address] [place], [place] [address]   [facility]    Ages 42  and older  Hours: Mondays and Thursdays, 7:00 am - 9:00 am Patients are seen on a first come, first served basis. [phone], ext. [phone] [place], [place]  [facility]  Child care  Emergency assistance for housing and [employer]ark[employer]ilities  Food stamps  Medicaid [phone] [address] [place], [place]  [phone]  [employer]  Medication assistance  Rental assistance  Food pantry  Medication assistance  Housing assistance  Emergency food distribution  Utility assistance [phone] [address] [place], [place]  [phone]  [address] [place], [place] [phone] Hours: Tuesdays and Thursdays from 9am - 12 noon by appointment only  [phone] [address] [place], [place] [phone]  [facility] Lanae Boast[facility]ara [facility]. [facility]   Accepts private insurance, [place], and [place].  Payment is based on a sliding scale for those without insurance.  Hours: Mondays, Tuesdays and Thursdays, 8:30 am - 5:30 pm.   [phone] [address] [place], [place]  [facility] - [facility] at [facility]    Accepts private insurance, [place], and [place].  Payment is based on a sliding scale for those without insurance. [phone] [address] [place], [place]  [facility] - Pediatrics at [address] Scientist, research (physical sciences)[place]  Accepts private insurance, [employer] EntertainmentMedicare, and [place].  Payment is based on a sliding scale for those without insurance [phone] [address], [place], [place]  [facility] - [facility] at [place]  Accepts private insurance, [place], and [place].  Payment is based on a sliding scale for those without insurance. [phone] [address] [place], [place]  [facility] - [facility] at [facility]  Accepts private insurance, [place], and [place].  Payment is based on a sliding scale for those without insurance. [phone], ext. [phone] [place], [place].    [facility]  Maternity care.  Accepts Medicaid and self-pay. [phone] [place], [place]

## 2016-04-03 NOTE — ED Provider Notes (Signed)
CSN: [id number]     Arrival date & time 04/03/16  1940 History   First MD Initiated Contact with Patient 04/03/16 2149     Chief Complaint  Patient presents with  . Abdominal Pain     (Consider location/radiation/quality/duration/timing/severity/associated sxs/prior Treatment) HPI   Blood pressure 168/75, pulse 79, temperature 98.3 F (36.8 C), resp. rate 16, height 6\' 2"  (1.88 m), weight 102.059 kg, SpO2 100 %.  Andrew Padilla is a 25 y.o. male complaining of suprapubic abdominal pain onset 2 days ago rated at 7 out of 10 associated with frequent loose non-melanotic, nonbloody stool. Patient denies fever, chills, nausea, vomiting, dysuria, hematuria, testicular pain, testicular swelling, urethritis. Patient took Aleve a few days ago which helps with the pain until it wore off.  History reviewed. No pertinent past medical history. History reviewed. No pertinent past surgical history. History reviewed. No pertinent family history. Social History  Substance Use Topics  . Smoking status: Never Smoker   . Smokeless tobacco: None  . Alcohol Use: No    Review of Systems  10 systems reviewed and found to be negative, except as noted in the HPI.   Allergies  Review of patient's allergies indicates no known allergies.  Home Medications   Prior to Admission medications   Medication Sig Start Date End Date Taking? Authorizing Provider  ciprofloxacin (CIPRO) 500 MG tablet Take 1 tablet (500 mg total) by mouth 2 (two) times daily. One po bid x 21 days 06/27/14   Toy CookeyMegan Docherty, MD   BP 168/75 mmHg  Pulse 79  Temp(Src) 98.3 F (36.8 C)  Resp 16  Ht 6\' 2"  (1.88 m)  Wt 102.059 kg  BMI 28.88 kg/m2  SpO2 100% Physical Exam  Constitutional: He is oriented to person, place, and time. He appears well-developed and well-nourished. No distress.  HENT:  Head: Normocephalic.  Eyes: Conjunctivae and EOM are normal.  Cardiovascular: Normal rate, regular rhythm and intact distal pulses.    Pulmonary/Chest: Effort normal and breath sounds normal. No stridor.  Abdominal:  No tenderness to deep palpation of any quadrant. Rovsing, so as an obturator are negative.  Musculoskeletal: Normal range of motion.  Neurological: He is alert and oriented to person, place, and time.  Psychiatric: He has a normal mood and affect.  Nursing note and vitals reviewed.   ED Course  Procedures (including critical care time) Labs Review Labs Reviewed  URINALYSIS, ROUTINE W REFLEX MICROSCOPIC (NOT AT [facility])    Imaging Review No results found. I have personally reviewed and evaluated these images and lab results as part of my medical decision-making.   EKG Interpretation None      MDM   Final diagnoses:  Lower abdominal pain    Filed Vitals:   04/03/16 1956  BP: 168/75  Pulse: 79  Temp: 98.3 F (36.8 C)  Resp: 16  Height: 6\' 2"  (1.88 m)  Weight: 102.059 kg  SpO2: 100%    Medications  sodium chloride 0.9 % bolus 1,000 mL (1,000 mLs Intravenous New Bag/Given 04/03/16 2220)  dicyclomine (BENTYL) capsule 10 mg (10 mg Oral Given 04/03/16 2221)  acetaminophen (TYLENOL) tablet 650 mg (650 mg Oral Given 04/03/16 2221)    Andrew Padilla is 25 y.o. male presenting with Suprapubic pain and diarrhea onset several days ago, abdominal exam is completely benign with no tenderness to deep palpation of any quadrant, normoactive bowel sounds. Urinalysis reassuring, blood work with no abnormalities. Likely intestinal spasm secondary to diarrhea. Repeat abdominal exam is benign, patient given  return precautions and Bentyl.  Evaluation does not show pathology that would require ongoing emergent intervention or inpatient treatment. Pt is hemodynamically stable and mentating appropriately. Discussed findings and plan with patient/guardian, who agrees with care plan. All questions answered. Return precautions discussed and outpatient follow up given.   Discharge Medication List as of 04/03/2016 11:41 PM     START taking these medications   Details  dicyclomine (BENTYL) 20 MG tablet Take 1 tablet (20 mg total) by mouth 2 (two) times daily., Starting 04/03/2016, Until Discontinued, [employer], PA-C 04/04/16 1610  Pricilla Loveless, MD 04/05/16 9604

## 2016-04-03 NOTE — ED Notes (Signed)
pt c/o lower abd pain x 3 days, denies n/v

## 2016-06-29 ENCOUNTER — Emergency Department (HOSPITAL_COMMUNITY)
Admission: EM | Admit: 2016-06-29 | Discharge: 2016-06-30 | Disposition: A | Discharge status: Home / Self Care | Payer: BLUE CROSS/BLUE SHIELD | Attending: Emergency Medicine | Admitting: Emergency Medicine

## 2016-06-29 ENCOUNTER — Encounter (HOSPITAL_COMMUNITY): Payer: Self-pay | Admitting: *Deleted

## 2016-06-29 DIAGNOSIS — R350 Frequency of micturition: Secondary | ICD-10-CM | POA: Insufficient documentation

## 2016-06-29 DIAGNOSIS — R109 Unspecified abdominal pain: Secondary | ICD-10-CM | POA: Insufficient documentation

## 2016-06-29 LAB — URINALYSIS, ROUTINE W REFLEX MICROSCOPIC
Bilirubin Urine: NEGATIVE
GLUCOSE, UA: NEGATIVE mg/dL
HGB URINE DIPSTICK: NEGATIVE
Ketones, ur: NEGATIVE mg/dL
LEUKOCYTES UA: NEGATIVE
Nitrite: NEGATIVE
PH: 6.5 (ref 5.0–8.0)
PROTEIN: NEGATIVE mg/dL
Specific Gravity, Urine: 1.023 (ref 1.005–1.030)

## 2016-06-29 NOTE — ED Triage Notes (Signed)
Pt states that for the last several days he has had urinary frequency and urgency; pt states that he is having burning to bladder and states that even after he urinates he still feels like he needs to go; pt states that he is also having clear discharge from his penis

## 2016-06-30 LAB — CBG MONITORING, ED: GLUCOSE-CAPILLARY: 104 mg/dL — AB (ref 65–99)

## 2016-06-30 LAB — GC/CHLAMYDIA PROBE AMP (~~LOC~~) NOT AT ARMC
Chlamydia: NEGATIVE
NEISSERIA GONORRHEA: NEGATIVE

## 2016-06-30 NOTE — Discharge Instructions (Signed)
Continue to remain well hydrated. Follow up with a urologist. You have been tested for STDs and will be notified in 48 hours if your results are positive. Return, as needed, for worsening symptoms.

## 2016-06-30 NOTE — ED Notes (Signed)
Confirmed w/ lab, GC/ Chlyamydia is in progress.

## 2016-06-30 NOTE — ED Provider Notes (Signed)
[facility]-EMERGENCY DEPT Provider Note   CSN: [id number] Arrival date & time: 06/29/16  2039  First Provider Contact:  None  By signing my name below, I, Levon Hedger, attest that this documentation has been prepared under the direction and in the presence of non-physician practitioner,  Antony Madura, PA-C Electronically Signed: Levon Hedger, Scribe. 06/30/2016. 8:46 PM.   History   Chief Complaint Chief Complaint  Patient presents with  . Urinary Tract Infection    HPI Andrew Padilla is a 25 y.o. male who presents to the Emergency Department complaining of incread urinary frequency onset one week ago. He reports urinating up to 3 times an hour. He has seen a urologist at [facility] for frequency in the past and was told everything was normal. He also notes suprapubic discomfort with voiding and clear penile discharge x 1 week. Pt has had one sexual partner in the last six months. He states he does not use protection, but has no concern for possible STD. 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 Pt denies fever, nausea, vomiting, hematuria, constipation, diarrhea, melena, scrotum swelling or erythema, penile changes or testicular tenderness.   The history is provided by the patient. No language interpreter was used.    History reviewed. No pertinent past medical history.  There are no active problems to display for this patient.   History reviewed. No pertinent surgical history.    Home Medications    Prior to Admission medications   Medication Sig Start Date End Date Taking? Authorizing Provider  ciprofloxacin (CIPRO) 500 MG tablet Take 1 tablet (500 mg total) by mouth 2 (two) times daily. One po bid x 21 days 06/27/14   Toy Cookey, MD  dicyclomine (BENTYL) 20 MG tablet Take 1 tablet (20 mg total) by mouth 2 (two) times daily. 04/03/16   Joni Reining Pisciotta, PA-C    Family History No family history on file.  Social History Social History  Substance Use  Topics  . Smoking status: Never Smoker  . Smokeless tobacco: Never Used  . Alcohol use No     Allergies   Review of patient's allergies indicates no known allergies.   Review of Systems Review of Systems  Gastrointestinal: Positive for abdominal pain.  Genitourinary: Positive for discharge, dysuria and frequency.  10 Systems reviewed and are negative for acute change except as noted in the HPI.    Physical Exam Updated Vital Signs BP 166/75 (BP Location: Left Arm)   Pulse 68   Temp 99.3 F (37.4 C) (Oral)   Resp 18   SpO2 100%   Physical Exam  Constitutional: He is oriented to person, place, and time. He appears well-developed and well-nourished. No distress.  HENT:  Head: Normocephalic and atraumatic.  Eyes: Conjunctivae and EOM are normal. No scleral icterus.  Neck: Normal range of motion.  Pulmonary/Chest: Effort normal. No respiratory distress.  Respirations even and unlabored  Abdominal: Soft. He exhibits no distension. There is no tenderness. There is no guarding. Hernia confirmed negative in the right inguinal area and confirmed negative in the left inguinal area.  Soft, nontender, nondistended abdomen.  Genitourinary: Right testis shows no mass, no swelling and no tenderness. Right testis is descended. Left testis shows no mass, no swelling and no tenderness. Left testis is descended. Circumcised. No penile erythema. No discharge found.  Genitourinary Comments: Unremarkable GU exam; chaperoned by scribe Patient declined rectal exam as he states he had this done 3 months ago and it "was fine".  Musculoskeletal:  Normal range of motion.  Neurological: He is alert and oriented to person, place, and time.  Skin: Skin is warm and dry. No rash noted. He is not diaphoretic. No erythema. No pallor.  Psychiatric: He has a normal mood and affect. His behavior is normal.  Nursing note and vitals reviewed.    ED Treatments / Results  DIAGNOSTIC STUDIES:  Oxygen  Saturation is 100% on RA, normal by my interpretation.    COORDINATION OF CARE:  1:00 AM Pt to follow up with urologist. Discussed treatment plan with pt at bedside and pt agreed to plan.   Labs (all labs ordered are listed, but only abnormal results are displayed) Labs Reviewed  CBG MONITORING, ED - Abnormal; Notable for the following:       Result Value   Glucose-Capillary 104 (*)    All other components within normal limits  URINALYSIS, ROUTINE W REFLEX MICROSCOPIC (NOT AT [facility])  GC/CHLAMYDIA PROBE AMP ([place]) NOT AT [facility]    EKG  EKG Interpretation None       Radiology No results found.  Procedures Procedures (including critical care time)  Medications Ordered in ED Medications - No data to display   Initial Impression / Assessment and Plan / ED Course  I have reviewed the triage vital signs and the nursing notes.  Pertinent labs & imaging results that were available during my care of the patient were reviewed by me and considered in my medical decision making (see chart for details).  Clinical Course    25 year old male presents to the emergency department for evaluation of urinary frequency. He reports a history of similar symptoms in the past for which she has seen a urologist. Patient with an unremarkable exam today. His urinalysis does not suggest UTI. There is also no pyuria to suggest underlying sexual transmitted infection. No genital lesions. Given chronicity of symptoms, low suspicion for emergent or infectious process. Gonorrhea and chlamydia cultures have been sent. Patient advised follow-up with urology regarding his visit today. Return precautions discussed and provided. Patient discharged in satisfactory condition with no unaddressed concerns.   Final Clinical Impressions(s) / ED Diagnoses   Final diagnoses:  Urinary frequency    I personally performed the services described in this documentation, which was scribed in my presence. The  recorded information has been reviewed and is accurate.    New Prescriptions Discharge Medication List as of 06/30/2016  1:07 AM       Antony MaduraKelly Andrika Peraza, PA-C 06/30/16 2053    Paula LibraJohn Molpus, MD 06/30/16 2248

## 2016-10-17 ENCOUNTER — Emergency Department (HOSPITAL_COMMUNITY): Payer: BLUE CROSS/BLUE SHIELD

## 2016-10-17 ENCOUNTER — Encounter (HOSPITAL_COMMUNITY): Payer: Self-pay | Admitting: Emergency Medicine

## 2016-10-17 ENCOUNTER — Emergency Department (HOSPITAL_COMMUNITY)
Admission: EM | Admit: 2016-10-17 | Discharge: 2016-10-17 | Disposition: A | Discharge status: Home / Self Care | Payer: BLUE CROSS/BLUE SHIELD | Attending: Emergency Medicine | Admitting: Emergency Medicine

## 2016-10-17 DIAGNOSIS — R0789 Other chest pain: Secondary | ICD-10-CM | POA: Insufficient documentation

## 2016-10-17 DIAGNOSIS — R079 Chest pain, unspecified: Secondary | ICD-10-CM

## 2016-10-17 LAB — CBC
HEMATOCRIT: 43.7 % (ref 39.0–52.0)
Hemoglobin: 14.6 g/dL (ref 13.0–17.0)
MCH: 31.1 pg (ref 26.0–34.0)
MCHC: 33.4 g/dL (ref 30.0–36.0)
MCV: 93 fL (ref 78.0–100.0)
PLATELETS: 329 10*3/uL (ref 150–400)
RBC: 4.7 MIL/uL (ref 4.22–5.81)
RDW: 14 % (ref 11.5–15.5)
WBC: 6.6 10*3/uL (ref 4.0–10.5)

## 2016-10-17 LAB — BASIC METABOLIC PANEL
Anion gap: 8 (ref 5–15)
BUN: 15 mg/dL (ref 6–20)
CHLORIDE: 103 mmol/L (ref 101–111)
CO2: 27 mmol/L (ref 22–32)
CREATININE: 1.24 mg/dL (ref 0.61–1.24)
Calcium: 9.3 mg/dL (ref 8.9–10.3)
GFR calc Af Amer: >60 mL/min (ref >60)
GFR calc non Af Amer: >60 mL/min (ref >60)
Glucose, Bld: 132 mg/dL — ABNORMAL HIGH (ref 65–99)
POTASSIUM: 3.6 mmol/L (ref 3.5–5.1)
Sodium: 138 mmol/L (ref 135–145)

## 2016-10-17 LAB — I-STAT TROPONIN, ED
Troponin i, poc: 0 ng/mL (ref 0.00–0.08)
Troponin i, poc: 0 ng/mL (ref 0.00–0.08)

## 2016-10-17 LAB — D-DIMER, QUANTITATIVE (NOT AT ARMC)

## 2016-10-17 LAB — MAGNESIUM: MAGNESIUM: 2.3 mg/dL (ref 1.7–2.4)

## 2016-10-17 MED ORDER — HYDROCODONE-ACETAMINOPHEN 5-325 MG PO TABS: 1.0000 | ORAL_TABLET | Freq: Four times a day (QID) | ORAL | 0 refills | Status: AC | PRN

## 2016-10-17 MED ORDER — ASPIRIN 81 MG PO CHEW
324.0000 mg | CHEWABLE_TABLET | Freq: Once | ORAL | Status: AC
  Administered 2016-10-17: 324 mg via ORAL
  Filled 2016-10-17: qty 4

## 2016-10-17 MED ORDER — SODIUM CHLORIDE 0.9 % IV BOLUS (SEPSIS)
1000.0000 mL | Freq: Once | INTRAVENOUS | Status: AC
  Administered 2016-10-17: 1000 mL via INTRAVENOUS

## 2016-10-17 MED ORDER — CYCLOBENZAPRINE HCL 10 MG PO TABS: 10.0000 mg | ORAL_TABLET | Freq: Two times a day (BID) | ORAL | 0 refills | Status: AC | PRN

## 2016-10-17 NOTE — ED Notes (Signed)
Gave MD copy of 2nd draw troponin results.  Per Judeth CornfieldStephanie in phlebotomy, results are not "crossing over".

## 2016-10-17 NOTE — ED Triage Notes (Signed)
Patient c/o central chest pain that started this morning.  Patient denies any other symptoms with the chest pain.  Patient denies HTN, DM or smokoing

## 2016-10-17 NOTE — Discharge Instructions (Signed)
Please follow-up with a primary physician for further management of your chest pain. If symptoms return or worsen, please return to the nearest emergency department. Please take your medicines including pain medicine and muscle relaxants if pain continues.

## 2016-10-17 NOTE — ED Notes (Addendum)
Spoke to MD, waiting on repeat troponin due in 40 minutes.

## 2016-10-17 NOTE — ED Notes (Signed)
Troponin results pending.  Informed MD.  MD to see patient once back.

## 2016-10-17 NOTE — ED Notes (Signed)
Pt ambulated to the BR with steady gait.   

## 2016-10-17 NOTE — ED Provider Notes (Signed)
[facility]-EMERGENCY DEPT Provider Note   CSN: [id number] Arrival date & time: 10/17/16  1354     History   Chief Complaint Chief Complaint  Patient presents with  . Chest Pain    HPI Andrew Padilla is a 25 y.o. male with a reported past medical history of childhood asthma who presents with chest pain, nausea, lightheadedness, and fatigue. Patient reports that for the last few days, he has had the fatigue-like symptoms. He says that he has had nausea and lightheadedness, worsened with standing. He said he has had decreased appetite over this timeframe with decreased by mouth intake. He reports that he has not had any near syncope or syncopal episodes. He denies any palpitations. He describes that for the last few days he has had constant chest pain that he describes as pressure, and his central chest, nonradiating, moderate in severity, 5 out of 10, improved with deep breathing, and worsened by a reported propane-like smell. He denies any fevers, chills, shortness of breath, cough, history of chest trauma, consultation, diarrhea, or dysuria. He reports that he has had similar symptoms in the past which he was told might be related to high blood pressure. He does not take any blood pressure medications. He has no allergies to medications to his knowledge he has not taken aspirin.  He denies a family history of clotting disorders, family history of heart disease, or family history of DVT/PE. He does not smoke.        The history is provided by the patient and medical records. No language interpreter was used.  Chest Pain   This is a new problem. The current episode started more than 2 days ago. The problem occurs constantly. The problem has not changed since onset.The pain is present in the substernal region. The pain is at a severity of 5/10. The pain is moderate. The quality of the pain is described as pressure-like (improved with deep breathing). The pain does not radiate. Associated symptoms  include malaise/fatigue and nausea. Pertinent negatives include no abdominal pain, no back pain, no cough, no diaphoresis, no dizziness, no fever, no headaches, no irregular heartbeat, no leg pain, no lower extremity edema, no near-syncope, no numbness, no palpitations, no shortness of breath, no sputum production, no syncope, no vomiting and no weakness. He has tried nothing for the symptoms. The treatment provided no relief. There are no known risk factors.    History reviewed. No pertinent past medical history.  There are no active problems to display for this patient.   History reviewed. No pertinent surgical history.     Home Medications    Prior to Admission medications   Medication Sig Start Date End Date Taking? Authorizing Provider  acetaminophen (TYLENOL) 500 MG tablet Take 500 mg by mouth every 6 (six) hours as needed for mild pain.   Yes Historical Provider, MD  naproxen sodium (ANAPROX) 220 MG tablet Take 440 mg by mouth 2 (two) times daily as needed (Pain).   Yes Historical Provider, MD  ciprofloxacin (CIPRO) 500 MG tablet Take 1 tablet (500 mg total) by mouth 2 (two) times daily. One po bid x 21 days Patient not taking: Reported on 10/17/2016 06/27/14   Toy CookeyMegan Docherty, MD  dicyclomine (BENTYL) 20 MG tablet Take 1 tablet (20 mg total) by mouth 2 (two) times daily. Patient not taking: Reported on 10/17/2016 04/03/16   Wynetta EmeryNicole Pisciotta, PA-C    Family History No family history on file.  Social History Social History  Substance Use Topics  .  Smoking status: Never Smoker  . Smokeless tobacco: Never Used  . Alcohol use No     Allergies   Patient has no known allergies.   Review of Systems Review of Systems  Constitutional: Positive for appetite change and malaise/fatigue. Negative for activity change, chills, diaphoresis, fatigue and fever.  HENT: Negative for congestion and rhinorrhea.   Eyes: Negative for visual disturbance.  Respiratory: Negative for cough,  sputum production, chest tightness, shortness of breath, wheezing and stridor.   Cardiovascular: Positive for chest pain. Negative for palpitations, leg swelling, syncope and near-syncope.  Gastrointestinal: Positive for nausea. Negative for abdominal distention, abdominal pain, blood in stool, constipation, diarrhea and vomiting.  Genitourinary: Negative for difficulty urinating, dysuria and flank pain.  Musculoskeletal: Negative for back pain, gait problem, neck pain and neck stiffness.  Skin: Negative for rash and wound.  Neurological: Positive for light-headedness. Negative for dizziness, weakness, numbness and headaches.  Psychiatric/Behavioral: Negative for agitation.  All other systems reviewed and are negative.    Physical Exam Updated Vital Signs BP 156/75   Pulse 105   Temp 98.3 F (36.8 C)   Resp 22   Ht 6\' 2"  (1.88 m)   Wt 230 lb (104.3 kg)   SpO2 100%   BMI 29.53 kg/m   Physical Exam  Constitutional: He appears well-developed and well-nourished.  HENT:  Head: Normocephalic and atraumatic.  Mouth/Throat: Oropharynx is clear and moist. No oropharyngeal exudate.  Eyes: Conjunctivae are normal. Pupils are equal, round, and reactive to light.  Neck: Normal range of motion. Neck supple.  Cardiovascular: Regular rhythm and normal pulses.  Tachycardia present.  Exam reveals no decreased pulses.   Murmur heard.  Systolic murmur is present  Pulmonary/Chest: Effort normal and breath sounds normal. No stridor. No respiratory distress. He has no wheezes. He has no rales. He exhibits tenderness.    Abdominal: Soft. There is no tenderness.  Musculoskeletal: He exhibits no edema or tenderness.  Neurological: He is alert. He displays normal reflexes. No cranial nerve deficit or sensory deficit. He exhibits normal muscle tone. Coordination normal.  Skin: Skin is warm and dry. Capillary refill takes less than 2 seconds. No erythema. No pallor.  Psychiatric: He has a normal mood  and affect.  Nursing note and vitals reviewed.    ED Treatments / Results  Labs (all labs ordered are listed, but only abnormal results are displayed) Labs Reviewed  CBC  BASIC METABOLIC PANEL  I-STAT TROPOININ, ED    EKG  EKG Interpretation  Date/Time:  Tuesday October 17 2016 14:00:04 EST Ventricular Rate:  108 PR Interval:    QRS Duration: 100 QT Interval:  352 QTC Calculation: 472 R Axis:   83 Text Interpretation:  Sinus tachycardia RSR' in V1 or V2, right VCD or RVH Borderline prolonged QT interval Abnormal ECG No Prior ECG for comparison No STEMI Confirmed by Rush Landmark MD, CHRISTOPHER [phone]) on 10/17/2016 2:05:19 PM       Radiology No results found.  Procedures Procedures (including critical care time)  Medications Ordered in ED Medications - No data to display   Initial Impression / Assessment and Plan / ED Course  I have reviewed the triage vital signs and the nursing notes.  Pertinent labs & imaging results that were available during my care of the patient were reviewed by me and considered in my medical decision making (see chart for details).  Clinical Course     Andrew Padilla is a 25 y.o. male with a reported past medical history  of childhood asthma who presents with chest pain, nausea, lightheadedness, and fatigue.  History and exam are seen above.   On exam, patient's lungs are clear. Chest is mildly tender to palpation in the central chest. He says this reproduces his pressure-like pain. His abdomen is nontender. His back is nontender. His pulses are symmetric and equal. Neurologic exam unremarkable. No leg tenderness or leg swelling appreciated. Patient had a systolic murmur.  Initial EKG was abnormal and showed a borderline prolonged QT interval. There was sinus tachycardia. Patient was tachycardic in the 120s during initial evaluation.   Patient will be given aspirin during initial workup. Given tachycardia, suspect likely due to dehydration in  setting of decreased by mouth intake and anorexia however, d-dimer will also be sent. Screening laboratory testing, troponins, and chest x-ray will be obtained.  Heart score is 2.   Laboratory testing return showing nonelevated D dimer, and negative troponin times two. Chest x-ray showed no acute cardiopulmonary abnormality.  Given patients reassuring workup and low heart score, feel patient is appropriate for discharge. Suspect musculoskeletal pain given patients chest tenderness. Patient will follow up with PCP for further management of chest pain. Patient given return precautions for new or worsening symptoms. Patient had significant relief and chest pain while in the ED. Patient had no more episodes of chest pain. Patient given pain medicine and muscle relaxant due to potential skeletal wall pain. Patient agreed with plan and discharged in good condition.   Final Clinical Impressions(s) / ED Diagnoses   Final diagnoses:  Chest pain, unspecified type    New Prescriptions Discharge Medication List as of 10/17/2016  7:05 PM    START taking these medications   Details  cyclobenzaprine (FLEXERIL) 10 MG tablet Take 1 tablet (10 mg total) by mouth 2 (two) times daily as needed for muscle spasms., Starting Tue 10/17/2016, Print    HYDROcodone-acetaminophen (NORCO/VICODIN) 5-325 MG tablet Take 1 tablet by mouth every 6 (six) hours as needed., Starting Tue 10/17/2016, Print        Clinical Impression: 1. Chest pain, unspecified type     Disposition: Discharge  Condition: Good  I have discussed the results, Dx and Tx plan with the pt(& family if present). He/she/they expressed understanding and agree(s) with the plan. Discharge instructions discussed at great length. Strict return precautions discussed and pt &/or family have verbalized understanding of the instructions. No further questions at time of discharge.    Discharge Medication List as of 10/17/2016  7:05 PM    START taking  these medications   Details  cyclobenzaprine (FLEXERIL) 10 MG tablet Take 1 tablet (10 mg total) by mouth 2 (two) times daily as needed for muscle spasms., Starting Tue 10/17/2016, Print    HYDROcodone-acetaminophen (NORCO/VICODIN) 5-325 MG tablet Take 1 tablet by mouth every 6 (six) hours as needed., Starting Tue 10/17/2016, Print        Follow Up: [facility] [address] [place] [phone] Schedule an appointment as soon as possible for a visit    [facility] [address] [id number] [place] [phone]  If symptoms worsen     Heide Scaleshristopher J Tegeler, MD 10/18/16 [phone]

## 2016-10-17 NOTE — ED Notes (Signed)
Family at bedside. 

## 2016-10-17 NOTE — ED Notes (Signed)
Bedside report obtained.  Pt resting comfortably.  Denies any cp at this time.  IVF infusing.

## 2016-10-20 ENCOUNTER — Ambulatory Visit: Payer: Self-pay | Attending: Family Medicine | Admitting: Family Medicine

## 2016-10-20 ENCOUNTER — Encounter: Payer: Self-pay | Admitting: Family Medicine

## 2016-10-20 VITALS — BP 122/80 | HR 101 | Temp 98.9°F | Resp 20 | Ht 74.0 in | Wt 234.0 lb

## 2016-10-20 DIAGNOSIS — R42 Dizziness and giddiness: Secondary | ICD-10-CM | POA: Insufficient documentation

## 2016-10-20 DIAGNOSIS — R3589 Other polyuria: Secondary | ICD-10-CM

## 2016-10-20 DIAGNOSIS — R350 Frequency of micturition: Secondary | ICD-10-CM | POA: Insufficient documentation

## 2016-10-20 DIAGNOSIS — R8290 Unspecified abnormal findings in urine: Secondary | ICD-10-CM

## 2016-10-20 DIAGNOSIS — R358 Other polyuria: Secondary | ICD-10-CM | POA: Insufficient documentation

## 2016-10-20 LAB — POCT URINALYSIS DIPSTICK
Bilirubin, UA: NEGATIVE
GLUCOSE UA: NEGATIVE
Ketones, UA: NEGATIVE
Leukocytes, UA: NEGATIVE
NITRITE UA: NEGATIVE
PH UA: 6
PROTEIN UA: NEGATIVE
RBC UA: NEGATIVE
Spec Grav, UA: <=1.005
UROBILINOGEN UA: 0.2

## 2016-10-20 LAB — POCT GLYCOSYLATED HEMOGLOBIN (HGB A1C): HEMOGLOBIN A1C: 5.5

## 2016-10-20 MED ORDER — MECLIZINE HCL 25 MG PO TABS: 25.0000 mg | ORAL_TABLET | Freq: Three times a day (TID) | ORAL | 0 refills | Status: AC | PRN

## 2016-10-20 NOTE — Patient Instructions (Addendum)
Dizziness Dizziness is a common problem. It makes you feel unsteady or lightheaded. You may feel like you are about to pass out (faint). Dizziness can lead to injury if you stumble or fall. Anyone can get dizzy, but dizziness is more common in older adults. This condition can be caused by a number of things, including:  Medicines.  Dehydration.  Illness. Follow these instructions at home: Following these instructions may help with your condition: Eating and drinking  Drink enough fluid to keep your pee (urine) clear or pale yellow. This helps to keep you from getting dehydrated. Try to drink more clear fluids, such as water.  Do not drink alcohol.  Limit how much caffeine you drink or eat if told by your doctor.  Limit how much salt you drink or eat if told by your doctor. Activity  Avoid making quick movements.  When you stand up from sitting in a chair, steady yourself until you feel okay.  In the morning, first sit up on the side of the bed. When you feel okay, stand slowly while you hold onto something. Do this until you know that your balance is fine.  Move your legs often if you need to stand in one place for a long time. Tighten and relax your muscles in your legs while you are standing.  Do not drive or use heavy machinery if you feel dizzy.  Avoid bending down if you feel dizzy. Place items in your home so that they are easy for you to reach without leaning over. Lifestyle  Do not use any tobacco products, including cigarettes, chewing tobacco, or electronic cigarettes. If you need help quitting, ask your doctor.  Try to lower your stress level, such as with yoga or meditation. Talk with your doctor if you need help. General instructions  Watch your dizziness for any changes.  Take medicines only as told by your doctor. Talk with your doctor if you think that your dizziness is caused by a medicine that you are taking.  Tell a friend or a family member that you are  feeling dizzy. If he or she notices any changes in your behavior, have this person call your doctor.  Keep all follow-up visits as told by your doctor. This is important. Contact a doctor if:  Your dizziness does not go away.  Your dizziness or light-headedness gets worse.  You feel sick to your stomach (nauseous).  You have trouble hearing.  You have new symptoms.  You are unsteady on your feet or you feel like the room is spinning. Get help right away if:  You throw up (vomit) or have diarrhea and are unable to eat or drink anything.  You have trouble:  Talking.  Walking.  Swallowing.  Using your arms, hands, or legs.  You feel generally weak.  You are not thinking clearly or you have trouble forming sentences. It may take a friend or family member to notice this.  You have:  Chest pain.  Pain in your belly (abdomen).  Shortness of breath.  Sweating.  Your vision changes.  You are bleeding.  You have a headache.  You have neck pain or a stiff neck.  You have a fever. This information is not intended to replace advice given to you by your health care provider. Make sure you discuss any questions you have with your health care provider. Document Released: 10/26/2011 Document Revised: 04/13/2016 Document Reviewed: 11/02/2014 Elsevier Interactive Patient Education  2017 [employer]  Follow up as  needed.

## 2016-10-20 NOTE — Progress Notes (Signed)
Establish care: Denies any chest pain at this time. C/o frequent urination greater than 1 month; dizziness x 2 months.

## 2016-10-20 NOTE — Progress Notes (Signed)
LOGO@  Subjective:  Patient ID: Andrew Padilla, male    DOB: Jan 24, 1991  Age: 25 y.o. MRN: [id number]  CC: Hospitalization Follow-up; Polyuria; Urinary Frequency; and Dizziness   HPI Andrew Denseick Cowden presents for complaints of polyuria, increased urine frequency, and dizziness for 2 months. He also had a recent ED visit on 10/17/16 for chest pain. He denies any CP and SOB at this time. He denies any headaches. He reports occasional blurring of vision but denies any at this time. He denies any polydipsia. He reports his father was recently diagnosed with diabetes. He denies any cloudy urine or hematuria. He does report noticing malodor. He denies any penile discharge. He reports 1 sexual partner within the last 3 months. Sexual intercourse was unprotected.  Family History  Problem Relation Age of Onset  . Diabetes Father     Social History  Substance Use Topics  . Smoking status: Never Smoker  . Smokeless tobacco: Never Used  . Alcohol use No    ROS Review of Systems  Constitutional: Negative.   Eyes: Negative for visual disturbance.  Respiratory: Negative.   Cardiovascular: Negative.   Gastrointestinal: Negative.   Genitourinary: Positive for frequency and urgency. Negative for difficulty urinating, discharge and hematuria.    Objective:   Today's Vitals: BP 122/80   Pulse (!) 101   Temp 98.9 F (37.2 C) (Oral)   Resp 20   Ht 6\' 2"  (1.88 m)   Wt 234 lb (106.1 kg)   SpO2 99%   BMI 30.04 kg/m   Physical Exam  Constitutional: He is oriented to person, place, and time. He appears well-developed and well-nourished.  Eyes: Conjunctivae and EOM are normal. Pupils are equal, round, and reactive to light. Right eye exhibits no discharge. Left eye exhibits no discharge.  Cardiovascular: Normal rate, regular rhythm and normal heart sounds.   Pulmonary/Chest: Effort normal and breath sounds normal.  Neurological: He is alert and oriented to person, place, and time.  Psychiatric: He  has a normal mood and affect. His behavior is normal. Thought content normal.    Assessment & Plan:   Problem List Items Addressed This Visit    None    Visit Diagnoses    Polyuria    -  Primary   Relevant Orders   Urinalysis Dipstick (Completed)   HgB A1c   Urine cytology ancillary only -Vision acuity screening   Urine malodor       Relevant Orders   Urine cytology ancillary only Vertigo      Relevant Orders     meclizine (ANTIVERT) 25 MG tablet    Take 1 tablet (25 mg total) by mouth 3 (three) times daily as needed.        Meds ordered this encounter  Medications  . meclizine (ANTIVERT) 25 MG tablet    Sig: Take 1 tablet (25 mg total) by mouth 3 (three) times daily as needed.    Dispense:  90 tablet    Refill:  0    Order Specific Question:   Supervising Provider    Answer:   Quentin AngstJEGEDE, OLUGBEMIGA E [id number]]     Follow-up: Return as needed.Lizbeth Bark.    Kaylob Wallen R Tarik Teixeira FNP

## 2016-10-21 ENCOUNTER — Encounter: Payer: Self-pay | Admitting: Family Medicine

## 2016-10-23 LAB — URINE CYTOLOGY ANCILLARY ONLY
Chlamydia: NEGATIVE
NEISSERIA GONORRHEA: NEGATIVE
TRICH (WINDOWPATH): NEGATIVE

## 2016-10-24 ENCOUNTER — Telehealth: Payer: Self-pay

## 2016-10-24 NOTE — Telephone Encounter (Addendum)
Patient fully hipaa verif.   Rn advised patient per Arrie SenateMandesia Hairston, FNP:  -Urine sample was negative for chlamydia, gonorrhea, and trichomonas -Urinalysis is normal. No bacteria present. -HgbA1c is  5.5 which is normal. HgbA1c is a test to determine your average blood sugar levels over the last three months and is used to screen for diabetes. -Follow up as needed.  Patient verbalized understanding.

## 2017-07-07 ENCOUNTER — Emergency Department (HOSPITAL_COMMUNITY): Payer: Worker's Compensation

## 2017-07-07 ENCOUNTER — Emergency Department (HOSPITAL_COMMUNITY)
Admission: EM | Admit: 2017-07-07 | Discharge: 2017-07-07 | Disposition: A | Discharge status: Home / Self Care | Payer: Worker's Compensation | Attending: Emergency Medicine | Admitting: Emergency Medicine

## 2017-07-07 ENCOUNTER — Encounter (HOSPITAL_COMMUNITY): Payer: Self-pay | Admitting: *Deleted

## 2017-07-07 DIAGNOSIS — S0101XA Laceration without foreign body of scalp, initial encounter: Secondary | ICD-10-CM | POA: Diagnosis present

## 2017-07-07 DIAGNOSIS — Y9262 Dock or shipyard as the place of occurrence of the external cause: Secondary | ICD-10-CM | POA: Diagnosis not present

## 2017-07-07 DIAGNOSIS — Y998 Other external cause status: Secondary | ICD-10-CM | POA: Diagnosis not present

## 2017-07-07 DIAGNOSIS — W208XXA Other cause of strike by thrown, projected or falling object, initial encounter: Secondary | ICD-10-CM | POA: Diagnosis not present

## 2017-07-07 DIAGNOSIS — S0990XA Unspecified injury of head, initial encounter: Secondary | ICD-10-CM

## 2017-07-07 DIAGNOSIS — Y9389 Activity, other specified: Secondary | ICD-10-CM | POA: Diagnosis not present

## 2017-07-07 MED ORDER — TETANUS-DIPHTH-ACELL PERTUSSIS 5-2.5-18.5 LF-MCG/0.5 IM SUSP: 0.5000 mL | Freq: Once | INTRAMUSCULAR | Status: DC

## 2017-07-07 MED ORDER — LIDOCAINE-EPINEPHRINE (PF) 2 %-1:200000 IJ SOLN: 20.0000 mL | Freq: Once | INTRAMUSCULAR | Status: DC

## 2017-07-07 MED ORDER — METHOCARBAMOL 500 MG PO TABS: 500.0000 mg | ORAL_TABLET | Freq: Three times a day (TID) | ORAL | 0 refills | Status: AC | PRN

## 2017-07-07 MED ORDER — METHOCARBAMOL 500 MG PO TABS
500.0000 mg | ORAL_TABLET | Freq: Once | ORAL | Status: AC
  Administered 2017-07-07: 500 mg via ORAL
  Filled 2017-07-07: qty 1

## 2017-07-07 MED ORDER — LIDOCAINE-EPINEPHRINE (PF) 2 %-1:200000 IJ SOLN
10.0000 mL | Freq: Once | INTRAMUSCULAR | Status: AC
  Administered 2017-07-07: 10 mL via INTRADERMAL
  Filled 2017-07-07: qty 20

## 2017-07-07 MED ORDER — NAPROXEN 500 MG PO TABS: 500.0000 mg | ORAL_TABLET | Freq: Two times a day (BID) | ORAL | 0 refills | Status: AC

## 2017-07-07 MED ORDER — NAPROXEN 250 MG PO TABS
500.0000 mg | ORAL_TABLET | Freq: Once | ORAL | Status: AC
  Administered 2017-07-07: 500 mg via ORAL
  Filled 2017-07-07: qty 2

## 2017-07-07 NOTE — Discharge Instructions (Signed)
As discussed, please follow-up with your primary care provider for staple removal in 7-10 days.  Monitor for any signs of infection and return to be seen if you have increased swelling, increased pain, purulence, redness, fever, chills. Return to the emergency department if you experience blurred vision, bad headache, dizziness, nausea, vomiting or other concerning symptoms in the meantime.

## 2017-07-07 NOTE — ED Provider Notes (Signed)
[facility]-EMERGENCY DEPT Provider Note   CSN: [id number] Arrival date & time: 07/07/17  4098     History   Chief Complaint Chief Complaint  Patient presents with  . Head Laceration    HPI Andrew Padilla is a 26 y.o. male Presenting with head trauma from an industrial dock opening door falling off the railing and hitting him straight on the head causing a large laceration over the parietal scalp.bleeding is controlled. Patient reports that he fell to the ground and it slid off onto his left shoulder causing left shoulder pain. He denies loss of consciousness reports seeing stars and feeling nauseated. He is currently experiencing a headache. Denies numbness, weakness, vomiting, blurry vision. Tetanus up-to-date per patient.   HPI  History reviewed. No pertinent past medical history.  There are no active problems to display for this patient.   History reviewed. No pertinent surgical history.     Home Medications    Prior to Admission medications   Medication Sig Start Date End Date Taking? Authorizing Provider  cyclobenzaprine (FLEXERIL) 10 MG tablet Take 1 tablet (10 mg total) by mouth 2 (two) times daily as needed for muscle spasms. Patient not taking: Reported on 10/20/2016 10/17/16   Tegeler, Canary Brim, MD  dicyclomine (BENTYL) 20 MG tablet Take 1 tablet (20 mg total) by mouth 2 (two) times daily. Patient not taking: Reported on 10/17/2016 04/03/16   Pisciotta, Joni Reining, PA-C  HYDROcodone-acetaminophen (NORCO/VICODIN) 5-325 MG tablet Take 1 tablet by mouth every 6 (six) hours as needed. Patient not taking: Reported on 10/20/2016 10/17/16   Tegeler, Canary Brim, MD  meclizine (ANTIVERT) 25 MG tablet Take 1 tablet (25 mg total) by mouth 3 (three) times daily as needed. Patient not taking: Reported on 07/07/2017 10/20/16   Lizbeth Bark, FNP  methocarbamol (ROBAXIN) 500 MG tablet Take 1 tablet (500 mg total) by mouth every 8 (eight) hours as needed for muscle spasms. 07/07/17    Mathews Robinsons B, PA-C  naproxen (NAPROSYN) 500 MG tablet Take 1 tablet (500 mg total) by mouth 2 (two) times daily with a meal. 07/07/17   Georgiana Shore, PA-C    Family History Family History  Problem Relation Age of Onset  . Diabetes Father     Social History Social History  Substance Use Topics  . Smoking status: Never Smoker  . Smokeless tobacco: Never Used  . Alcohol use No     Allergies   Patient has no known allergies.   Review of Systems Review of Systems  Constitutional: Negative for chills and fever.  HENT: Negative for ear pain and facial swelling.   Eyes: Positive for visual disturbance. Negative for photophobia, pain and redness.       Immediately once head he reports seeing "stars". No visual disturbances at this time.  Respiratory: Negative for cough, choking, chest tightness, shortness of breath, wheezing and stridor.   Cardiovascular: Negative for chest pain, palpitations and leg swelling.  Gastrointestinal: Positive for nausea. Negative for abdominal distention, abdominal pain and vomiting.       Immediately experienced nausea when it happened but not at this time.  Genitourinary: Negative for difficulty urinating, flank pain and hematuria.  Musculoskeletal: Positive for arthralgias and myalgias. Negative for back pain, gait problem, joint swelling, neck pain and neck stiffness.  Skin: Positive for wound. Negative for color change, pallor and rash.  Neurological: Positive for headaches. Negative for dizziness, tremors, seizures, syncope, facial asymmetry, speech difficulty, weakness, light-headedness and numbness.     Physical  Exam Updated Vital Signs BP (!) 140/96 (BP Location: Right Arm)   Pulse 62   Temp 98.6 F (37 C) (Oral)   Resp 16   SpO2 100%   Physical Exam  Constitutional: He is oriented to person, place, and time. He appears well-developed and well-nourished. No distress.  Afebrile, nontoxic-appearing, sitting up in chair in no  acute distress.  HENT:  Head: Normocephalic and atraumatic.  Mouth/Throat: Oropharynx is clear and moist. No oropharyngeal exudate.  Eyes: Pupils are equal, round, and reactive to light. Conjunctivae and EOM are normal. Right eye exhibits no discharge. Left eye exhibits no discharge.  Neck: Normal range of motion. Neck supple.  Cardiovascular: Normal rate, regular rhythm, normal heart sounds and intact distal pulses.   No murmur heard. Pulmonary/Chest: Effort normal and breath sounds normal. No respiratory distress. He has no wheezes. He has no rales. He exhibits no tenderness.  Patient reports mild tenderness to palpation of the left clavicle and pectoral muscle. Faint ecchymosis appears to be developing over the left pectoral muscle with tenderness palpation.  Abdominal: Soft. He exhibits no distension and no mass. There is no tenderness. There is no rebound and no guarding.  No ecchymosis or abrasion, abdomen is soft nontender to palpation.  Musculoskeletal: He exhibits tenderness. He exhibits no edema or deformity.  slightly decreased range of motion to flexion and abduction of the left shoulder due to pain. No bony tenderness palpation. No midline tenderness palpation of entire spine Tenderness palpation of the left trapezius muscle  Neurological: He is alert and oriented to person, place, and time. No cranial nerve deficit or sensory deficit. He exhibits normal muscle tone. Coordination normal.  Neurologic Exam:  - Mental status: Patient is alert and cooperative. Fluent speech and words are clear. Coherent thought processes and insight is good. Patient is oriented x 4 to person, place, time and event.  - Cranial nerves:  CN III, IV, VI: pupils equally round, reactive to light both direct and conscensual. Full extra-ocular movement. CN V: motor temporalis and masseter strength intact. CN VII : muscles of facial expression intact. CN X :  midline uvula. XI strength of sternocleidomastoid and  trapezius muscles 5/5, XII: tongue is midline when protruded. - Motor: No involuntary movements. Muscle tone and bulk normal throughout. Muscle strength is 5/5 in bilateral shoulder abduction, elbow flexion and extension, grip, hip extension, flexion, leg flexion and extension, ankle dorsiflexion and plantar flexion.  - Sensory: Proprioception, light tough sensation intact in all extremities.  - Cerebellar: rapid alternating movements and point to point movement intact in upper and lower extremities. Normal stance and gait.  Skin: Skin is warm and dry. No rash noted. He is not diaphoretic. No erythema. No pallor.  Psychiatric: He has a normal mood and affect.  Nursing note and vitals reviewed.      ED Treatments / Results  Labs (all labs ordered are listed, but only abnormal results are displayed) Labs Reviewed - No data to display  EKG  EKG Interpretation None       Radiology Dg Chest 2 View  Result Date: 07/07/2017 CLINICAL DATA:  Trauma, left clavicular tenderness to palpation. EXAM: CHEST  2 VIEW COMPARISON:  10/17/2016 chest radiograph. FINDINGS: Stable cardiomediastinal silhouette with normal heart size. No pneumothorax. No pleural effusion. Lungs appear clear, with no acute consolidative airspace disease and no pulmonary edema. No displaced fractures. IMPRESSION: No active cardiopulmonary disease. Electronically Signed   By: Delbert Phenix M.D.   On: 07/07/2017 15:15  Ct Head Wo Contrast  Result Date: 07/07/2017 CLINICAL DATA:  Pt reports a garage door. Hit him on top of the head, Has laceration to top of head. EXAM: CT HEAD WITHOUT CONTRAST CT CERVICAL SPINE WITHOUT CONTRAST TECHNIQUE: Multidetector CT imaging of the head and cervical spine was performed following the standard protocol without intravenous contrast. Multiplanar CT image reconstructions of the cervical spine were also generated. COMPARISON:  None. FINDINGS: CT HEAD FINDINGS Brain: No evidence of acute infarction,  hemorrhage, hydrocephalus, extra-axial collection or mass lesion/mass effect. Vascular: No hyperdense vessel or unexpected calcification. Skull: No osseous abnormality. Sinuses/Orbits: Visualized paranasal sinuses are clear. Visualized mastoid sinuses are clear. Visualized orbits demonstrate no focal abnormality. Other: None CT CERVICAL SPINE FINDINGS Alignment: No static listhesis. Loss of the normal cervical lordosis with mild reversal. Skull base and vertebrae: No acute fracture. No primary bone lesion or focal pathologic process. Soft tissues and spinal canal: No prevertebral fluid or swelling. No visible canal hematoma. Disc levels:  Disc spaces are maintained. Upper chest: Lung apices are clear. Other: No fluid collection or hematoma. IMPRESSION: 1. No acute intracranial pathology. 2. No acute osseous injury of the cervical spine. Electronically Signed   By: Elige Ko   On: 07/07/2017 13:40   Ct Cervical Spine Wo Contrast  Result Date: 07/07/2017 CLINICAL DATA:  Pt reports a garage door. Hit him on top of the head, Has laceration to top of head. EXAM: CT HEAD WITHOUT CONTRAST CT CERVICAL SPINE WITHOUT CONTRAST TECHNIQUE: Multidetector CT imaging of the head and cervical spine was performed following the standard protocol without intravenous contrast. Multiplanar CT image reconstructions of the cervical spine were also generated. COMPARISON:  None. FINDINGS: CT HEAD FINDINGS Brain: No evidence of acute infarction, hemorrhage, hydrocephalus, extra-axial collection or mass lesion/mass effect. Vascular: No hyperdense vessel or unexpected calcification. Skull: No osseous abnormality. Sinuses/Orbits: Visualized paranasal sinuses are clear. Visualized mastoid sinuses are clear. Visualized orbits demonstrate no focal abnormality. Other: None CT CERVICAL SPINE FINDINGS Alignment: No static listhesis. Loss of the normal cervical lordosis with mild reversal. Skull base and vertebrae: No acute fracture. No primary  bone lesion or focal pathologic process. Soft tissues and spinal canal: No prevertebral fluid or swelling. No visible canal hematoma. Disc levels:  Disc spaces are maintained. Upper chest: Lung apices are clear. Other: No fluid collection or hematoma. IMPRESSION: 1. No acute intracranial pathology. 2. No acute osseous injury of the cervical spine. Electronically Signed   By: Elige Ko   On: 07/07/2017 13:40   Dg Shoulder Left  Result Date: 07/07/2017 CLINICAL DATA:  Struck by car rides door today. Pain and limited range of motion. EXAM: LEFT SHOULDER - 2+ VIEW COMPARISON:  None. FINDINGS: There is no evidence of fracture or dislocation. There is no evidence of arthropathy or other focal bone abnormality. Soft tissues are unremarkable. IMPRESSION: Negative. Electronically Signed   By: Paulina Fusi M.D.   On: 07/07/2017 15:16    Procedures Procedures (including critical care time) LACERATION REPAIR Performed by: Georgiana Shore Authorized by: Georgiana Shore Consent: Verbal consent obtained. Risks and benefits: risks, benefits and alternatives were discussed Consent given by: patient Patient identity confirmed: provided demographic data Prepped and Draped in normal sterile fashion Wound explored  Laceration Location: scalp parietal coronal plane  Laceration Length: 10cm  No Foreign Bodies seen or palpated  Anesthesia: local infiltration  Local anesthetic: lidocaine 1% with epinephrine  Anesthetic total: 5 ml  Irrigation method: syringe Amount of cleaning: standard  Skin closure: Staples  Number of sutures: 16  Technique: N/A  Patient tolerance: Patient tolerated the procedure well with no immediate complications. Medications Ordered in ED Medications  lidocaine-EPINEPHrine (XYLOCAINE W/EPI) 2 %-1:200000 (PF) injection 10 mL (10 mLs Intradermal Given 07/07/17 1229)  methocarbamol (ROBAXIN) tablet 500 mg (500 mg Oral Given 07/07/17 1622)  naproxen (NAPROSYN) tablet 500  mg (500 mg Oral Given 07/07/17 1622)     Initial Impression / Assessment and Plan / ED Course  I have reviewed the triage vital signs and the nursing notes.  Pertinent labs & imaging results that were available during my care of the patient were reviewed by me and considered in my medical decision making (see chart for details).    Patient presents after sustaining trauma to the head from a industrial dock sliding garage style door. 10 cm Laceration to the scalp closed by staples. Normal neuro exam, exam otherwise unremarkable.  Radiology without acute abnormality.patient improved while in the emergency department.  Pressure irrigation performed. Wound explored and base of wound visualized in a bloodless field without evidence of foreign body.  Laceration occurred < 8 hours prior to repair which was well tolerated. Tdap up to datte.  Pt has no comorbidities to effect normal wound healing. Pt discharged without antibiotics.   Discussed suture home care with patient and answered questions. Pt to follow-up for wound check and staple removal in 7-10 days; they are to return to the ED sooner for signs of infection.   Pt is hemodynamically stable with no complaints prior to dc.  He was observed for hours in the emergency department without symptoms. Patient was well-appearing nontoxic, normal vital signs and stable prior to discharge.  Discussed strict return precautions and advised to return to the emergency department if experiencing any new or worsening symptoms. Instructions were understood and patient agreed with discharge plan.  Final Clinical Impressions(s) / ED Diagnoses   Final diagnoses:  Laceration of scalp, initial encounter  Injury of head, initial encounter    New Prescriptions Discharge Medication List as of 07/07/2017  4:24 PM    START taking these medications   Details  methocarbamol (ROBAXIN) 500 MG tablet Take 1 tablet (500 mg total) by mouth every 8 (eight) hours as  needed for muscle spasms., Starting Sat 07/07/2017, Print    naproxen (NAPROSYN) 500 MG tablet Take 1 tablet (500 mg total) by mouth 2 (two) times daily with a meal., Starting Sat 07/07/2017, Print         Gregary Cromer 07/07/17 Elberta Spaniel, MD 07/08/17 1146

## 2017-07-07 NOTE — ED Notes (Signed)
Large, superficial to scalp, bleeding controlled. Denies LOC.

## 2017-07-07 NOTE — ED Notes (Signed)
Pt back to xray for plain films.

## 2017-07-07 NOTE — ED Notes (Signed)
Wound cleaned, has deeper area of laceration.

## 2017-07-07 NOTE — ED Notes (Signed)
Pt returned from CT °

## 2017-07-07 NOTE — ED Notes (Signed)
Returned from xray

## 2017-07-07 NOTE — ED Notes (Signed)
CT prefers pt's wound be stapled before coming to CT.

## 2017-07-07 NOTE — ED Triage Notes (Addendum)
Pt reports pulling down a garage door and it became unhinged, hit him on top of the head. No loc. Has laceration to top of head and minimal bleeding noted at triage. Denies loc.

## 2017-07-07 NOTE — ED Notes (Signed)
Pt now c/o neck pain. 

## 2017-12-18 ENCOUNTER — Encounter (HOSPITAL_BASED_OUTPATIENT_CLINIC_OR_DEPARTMENT_OTHER): Payer: Self-pay | Admitting: Emergency Medicine

## 2017-12-18 ENCOUNTER — Emergency Department (HOSPITAL_BASED_OUTPATIENT_CLINIC_OR_DEPARTMENT_OTHER)
Admission: EM | Admit: 2017-12-18 | Discharge: 2017-12-18 | Disposition: A | Discharge status: Home / Self Care | Payer: Self-pay | Attending: Emergency Medicine | Admitting: Emergency Medicine

## 2017-12-18 ENCOUNTER — Emergency Department (HOSPITAL_BASED_OUTPATIENT_CLINIC_OR_DEPARTMENT_OTHER): Payer: Self-pay

## 2017-12-18 ENCOUNTER — Other Ambulatory Visit: Payer: Self-pay

## 2017-12-18 DIAGNOSIS — R2241 Localized swelling, mass and lump, right lower limb: Secondary | ICD-10-CM | POA: Insufficient documentation

## 2017-12-18 DIAGNOSIS — M25571 Pain in right ankle and joints of right foot: Secondary | ICD-10-CM | POA: Insufficient documentation

## 2017-12-18 NOTE — ED Triage Notes (Signed)
Patient states that he has had intermittent swelling to his right ankle for about a month - patient also states that he has pain intermittently

## 2017-12-18 NOTE — ED Provider Notes (Signed)
[facility] EMERGENCY DEPARTMENT Provider Note   CSN: [id number] Arrival date & time: 12/18/17  [id number]     History   Chief Complaint Chief Complaint  Patient presents with  . Ankle Pain    HPI Andrew Padilla is a 27 y.o. male.  Patient is a 27 year old male who presents with pain to his right ankle.  He states it started about a month ago after he was playing outside in the snow.  He is not sure if he twisted it.  He does not remember any definite injury but it started right after that.  It has been sore since that time.  It is more on the outside of his ankle.  He has had some intermittent swelling.  He states it hurts to walk on it.  He is been using ibuprofen with some improvement in symptoms.  He denies any other known injuries.  No fevers.      History reviewed. No pertinent past medical history.  There are no active problems to display for this patient.   History reviewed. No pertinent surgical history.     Home Medications    Prior to Admission medications   Medication Sig Start Date End Date Taking? Authorizing Provider  cyclobenzaprine (FLEXERIL) 10 MG tablet Take 1 tablet (10 mg total) by mouth 2 (two) times daily as needed for muscle spasms. Patient not taking: Reported on 10/20/2016 10/17/16   Tegeler, Canary Brimhristopher J, MD  dicyclomine (BENTYL) 20 MG tablet Take 1 tablet (20 mg total) by mouth 2 (two) times daily. Patient not taking: Reported on 10/17/2016 04/03/16   Pisciotta, Joni ReiningNicole, PA-C  HYDROcodone-acetaminophen (NORCO/VICODIN) 5-325 MG tablet Take 1 tablet by mouth every 6 (six) hours as needed. Patient not taking: Reported on 10/20/2016 10/17/16   Tegeler, Canary Brimhristopher J, MD  meclizine (ANTIVERT) 25 MG tablet Take 1 tablet (25 mg total) by mouth 3 (three) times daily as needed. Patient not taking: Reported on 07/07/2017 10/20/16   Lizbeth BarkHairston, Mandesia R, FNP  methocarbamol (ROBAXIN) 500 MG tablet Take 1 tablet (500 mg total) by mouth every 8 (eight) hours  as needed for muscle spasms. 07/07/17   Mathews RobinsonsMitchell, Jessica B, PA-C  naproxen (NAPROSYN) 500 MG tablet Take 1 tablet (500 mg total) by mouth 2 (two) times daily with a meal. 07/07/17   Georgiana ShoreMitchell, Jessica B, PA-C    Family History Family History  Problem Relation Age of Onset  . Diabetes Father     Social History Social History   Tobacco Use  . Smoking status: Never Smoker  . Smokeless tobacco: Never Used  Substance Use Topics  . Alcohol use: No  . Drug use: No     Allergies   Patient has no known allergies.   Review of Systems Review of Systems  Constitutional: Negative for fever.  Gastrointestinal: Negative for nausea and vomiting.  Musculoskeletal: Positive for arthralgias and joint swelling. Negative for back pain and neck pain.  Skin: Negative for wound.  Neurological: Negative for weakness, numbness and headaches.     Physical Exam Updated Vital Signs BP (!) 156/79 (BP Location: Right Arm)   Pulse 84   Temp 98.5 F (36.9 C) (Oral)   Resp 18   Ht 6\' 2"  (1.88 m)   Wt 107 kg (236 lb)   SpO2 100%   BMI 30.30 kg/m   Physical Exam  Constitutional: He is oriented to person, place, and time. He appears well-developed and well-nourished.  HENT:  Head: Normocephalic and atraumatic.  Neck: Normal range  of motion. Neck supple.  Cardiovascular: Normal rate.  Pulmonary/Chest: Effort normal.  Musculoskeletal: He exhibits edema and tenderness.  Patient has mild swelling of the right ankle overlying the lateral malleolus.  There is no warmth or erythema.  No noticeable joint effusion.  There is tenderness mostly along the ligaments and mild tenderness to the lateral malleolus.  No bony tenderness to the foot.  No pain to the proximal fibula.  He has full range of motion in the foot.  There is normal sensation and motor function in the foot.  Pedal pulses are intact.  No wounds are noted.  Neurological: He is alert and oriented to person, place, and time.  Skin: Skin is warm  and dry.  Psychiatric: He has a normal mood and affect.     ED Treatments / Results  Labs (all labs ordered are listed, but only abnormal results are displayed) Labs Reviewed - No data to display  EKG  EKG Interpretation None       Radiology Dg Ankle Complete Right  Result Date: 12/18/2017 CLINICAL DATA:  Right ankle pain/swelling x1 month, no known injury EXAM: RIGHT ANKLE - COMPLETE 3+ VIEW COMPARISON:  None. FINDINGS: No fracture or dislocation is seen. The ankle mortise is intact. The base of the fifth metatarsal is unremarkable. IMPRESSION: Negative. Electronically Signed   By: Charline Bills M.D.   On: 12/18/2017 09:59    Procedures Procedures (including critical care time)  Medications Ordered in ED Medications - No data to display   Initial Impression / Assessment and Plan / ED Course  I have reviewed the triage vital signs and the nursing notes.  Pertinent labs & imaging results that were available during my care of the patient were reviewed by me and considered in my medical decision making (see chart for details).     No bony injuries are noted on x-ray imaging.  He likely has a sprain of his ankle versus tendinitis.  He is placed in an ASO.  He was advised on ice and elevation.  He was advised to use ibuprofen for symptomatic relief.  He was given a referral to follow-up with Dr. Pearletha Forge if his symptoms are not improving.  Final Clinical Impressions(s) / ED Diagnoses   Final diagnoses:  Acute right ankle pain    ED Discharge Orders    None       Rolan Bucco, MD 12/18/17 1035

## 2017-12-18 NOTE — ED Notes (Signed)
NAD at this time. Pt is stable and going home.  

## 2018-06-12 IMAGING — DX DG CHEST 2V
2 series · 2 of 2 positions shown · non-contrast
Comparison: 10/17/2016 chest radiograph.

CLINICAL DATA: Trauma, left clavicular tenderness to palpation.

EXAM:
CHEST  2 VIEW

[chest pa]
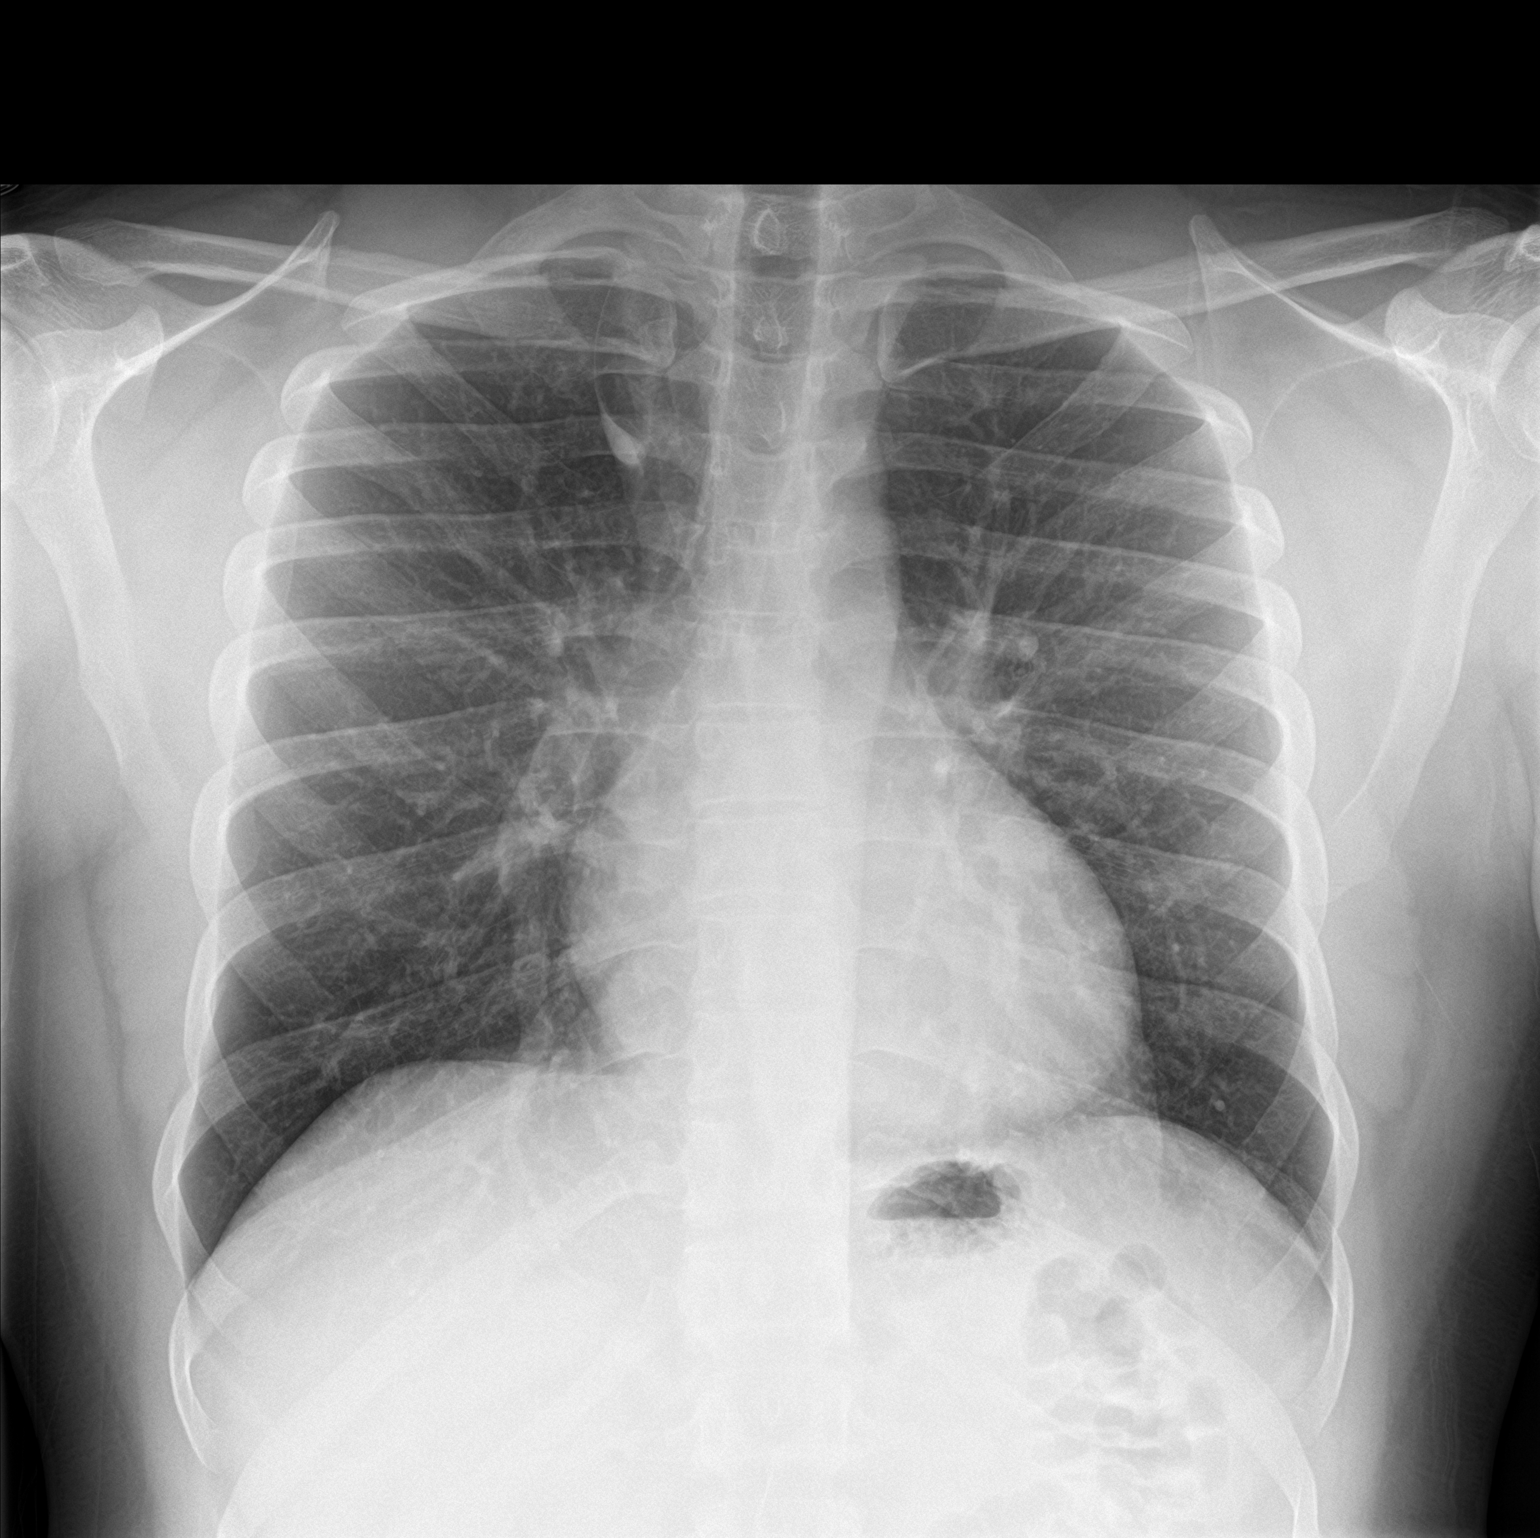

[chest lat]
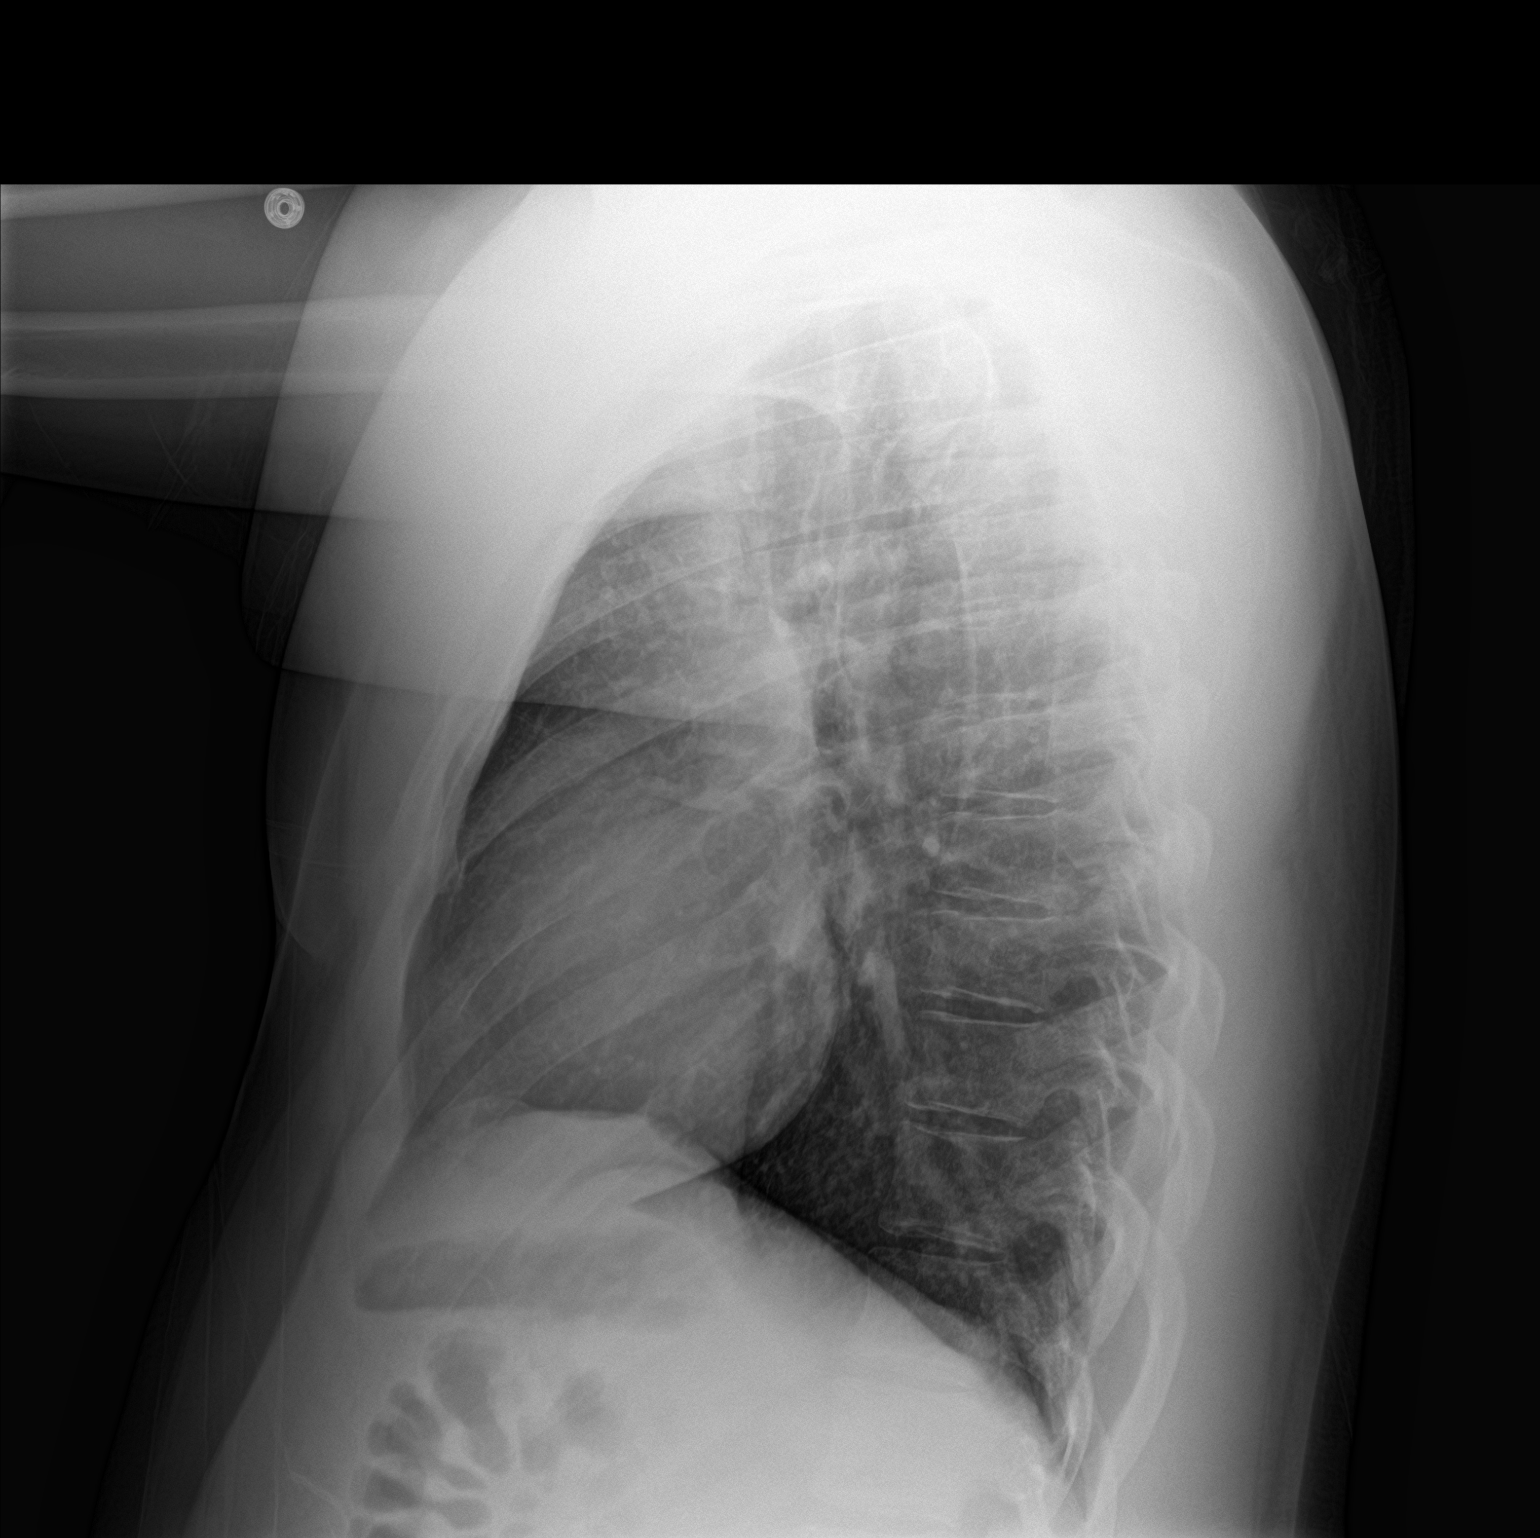

[2 of 2 positions shown; findings below may reference images not displayed]

FINDINGS: Stable cardiomediastinal silhouette with normal heart size. No
pneumothorax. No pleural effusion. Lungs appear clear, with no acute
consolidative airspace disease and no pulmonary edema. No displaced
fractures.
IMPRESSION: No active cardiopulmonary disease.

## 2018-07-13 ENCOUNTER — Encounter (HOSPITAL_BASED_OUTPATIENT_CLINIC_OR_DEPARTMENT_OTHER): Payer: Self-pay

## 2018-07-13 ENCOUNTER — Emergency Department (HOSPITAL_BASED_OUTPATIENT_CLINIC_OR_DEPARTMENT_OTHER)
Admission: EM | Admit: 2018-07-13 | Discharge: 2018-07-13 | Disposition: A | Discharge status: Home / Self Care | Payer: Self-pay | Attending: Emergency Medicine | Admitting: Emergency Medicine

## 2018-07-13 ENCOUNTER — Other Ambulatory Visit: Payer: Self-pay

## 2018-07-13 DIAGNOSIS — Z79899 Other long term (current) drug therapy: Secondary | ICD-10-CM | POA: Insufficient documentation

## 2018-07-13 DIAGNOSIS — A630 Anogenital (venereal) warts: Secondary | ICD-10-CM | POA: Insufficient documentation

## 2018-07-13 DIAGNOSIS — R300 Dysuria: Secondary | ICD-10-CM

## 2018-07-13 LAB — URINALYSIS, ROUTINE W REFLEX MICROSCOPIC
Bilirubin Urine: NEGATIVE
GLUCOSE, UA: NEGATIVE mg/dL
HGB URINE DIPSTICK: NEGATIVE
Ketones, ur: NEGATIVE mg/dL
Leukocytes, UA: NEGATIVE
Nitrite: NEGATIVE
PH: 5.5 (ref 5.0–8.0)
Protein, ur: NEGATIVE mg/dL
SPECIFIC GRAVITY, URINE: 1.025 (ref 1.005–1.030)

## 2018-07-13 NOTE — ED Provider Notes (Signed)
[facility] EMERGENCY DEPARTMENT Provider Note   CSN: [id number] Arrival date & time: 07/13/18  1934     History   Chief Complaint No chief complaint on file.   HPI Andrew Padilla is a 27 y.o. male.  HPI  Andrew Padilla is a 27 year old male with no significant past medical history who presents to the emergency department for evaluation of dysuria and penile discharge.  Patient reports that his symptoms began about a week ago.  Reports one episode of green penile discharge.  He states that he is sexually active with one male partner and denies regular condom use, although recently had a new male sexual partner and did use a condom for this.  He was seen at the urgent care several days ago and was treated prophylactically with Rocephin and azithromycin.  He states that he got a call today stating that his chlamydia and gonorrhea cultures were negative.  He denies fever, chills, abdominal pain, nausea/vomiting, testicular pain/swelling, painful bowel movements, flank pain.  He has a history of HPV with chronic penile warts.  History reviewed. No pertinent past medical history.  There are no active problems to display for this patient.   History reviewed. No pertinent surgical history.      Home Medications    Prior to Admission medications   Medication Sig Start Date End Date Taking? Authorizing Provider  cyclobenzaprine (FLEXERIL) 10 MG tablet Take 1 tablet (10 mg total) by mouth 2 (two) times daily as needed for muscle spasms. 10/17/16  Yes Tegeler, Canary Brimhristopher J, MD  dicyclomine (BENTYL) 20 MG tablet Take 1 tablet (20 mg total) by mouth 2 (two) times daily. 04/03/16  Yes Pisciotta, Joni ReiningNicole, PA-C  HYDROcodone-acetaminophen (NORCO/VICODIN) 5-325 MG tablet Take 1 tablet by mouth every 6 (six) hours as needed. 10/17/16  Yes Tegeler, Canary Brimhristopher J, MD  meclizine (ANTIVERT) 25 MG tablet Take 1 tablet (25 mg total) by mouth 3 (three) times daily as needed. 10/20/16  Yes  Hairston, Oren BeckmannMandesia R, FNP  methocarbamol (ROBAXIN) 500 MG tablet Take 1 tablet (500 mg total) by mouth every 8 (eight) hours as needed for muscle spasms. 07/07/17  Yes Mathews RobinsonsMitchell, Jessica B, PA-C  naproxen (NAPROSYN) 500 MG tablet Take 1 tablet (500 mg total) by mouth 2 (two) times daily with a meal. 07/07/17  Yes Georgiana ShoreMitchell, Jessica B, PA-C    Family History Family History  Problem Relation Age of Onset  . Diabetes Father     Social History Social History   Tobacco Use  . Smoking status: Never Smoker  . Smokeless tobacco: Never Used  Substance Use Topics  . Alcohol use: No  . Drug use: No     Allergies   Patient has no known allergies.   Review of Systems Review of Systems  Constitutional: Negative for chills and fever.  Gastrointestinal: Negative for abdominal pain, nausea, rectal pain and vomiting.  Genitourinary: Positive for discharge, dysuria, frequency and genital sores (chronic condylomata). Negative for difficulty urinating, flank pain, scrotal swelling and testicular pain.  Musculoskeletal: Negative for back pain.  Skin: Negative for rash.  Psychiatric/Behavioral: Negative for agitation.     Physical Exam Updated Vital Signs BP (!) 159/96 (BP Location: Left Arm)   Pulse 77   Temp 98.5 F (36.9 C) (Oral)   Resp 20   Ht 6\' 2"  (1.88 m)   Wt 110.2 kg   SpO2 100%   BMI 31.20 kg/m   Physical Exam  Constitutional: He appears well-developed and well-nourished. No distress.  HENT:  Head: Normocephalic and atraumatic.  Eyes: Right eye exhibits no discharge. Left eye exhibits no discharge.  Pulmonary/Chest: Effort normal. No respiratory distress.  Abdominal: Soft. There is no tenderness.  No CVA tenderness  Genitourinary:  Genitourinary Comments: Chaperone present for exam. Condylomata acuminata present on glans of the penis. No discharge from penis. No signs of lesion or erythema on the penis or testicles. The penis and testicles are nontender. No testicular  masses or swelling.  Neurological: He is alert. Coordination normal.  Skin: Skin is warm and dry. He is not diaphoretic.  Psychiatric: He has a normal mood and affect. His behavior is normal.  Nursing note and vitals reviewed.    ED Treatments / Results  Labs (all labs ordered are listed, but only abnormal results are displayed) Labs Reviewed  URINALYSIS, ROUTINE W REFLEX MICROSCOPIC    EKG None  Radiology No results found.  Procedures Procedures (including critical care time)  Medications Ordered in ED Medications - No data to display   Initial Impression / Assessment and Plan / ED Course  I have reviewed the triage vital signs and the nursing notes.  Pertinent labs & imaging results that were available during my care of the patient were reviewed by me and considered in my medical decision making (see chart for details).     Presents for evaluation of dysuria and penile discharge.  Per chart review he was seen at [facility] earlier this week where he was treated prophylactically for GC/chlamydia with Rocephin and azithromycin.  According to patient, he received a phone call today stating that GC/chlamydia cultures were negative.  UA today without evidence of infection.  No abdominal pain, vomiting, testicular pain/swelling or painful bowel movements.  Given presentation and exam findings I do not suspect epididymitis, prostatitis or emergent intra-abdominal process requiring further lab work or imaging studies.  I have counseled him to drink plenty of fluids and use ibuprofen as needed.  He declines HIV/syphilis testing.  Follow-up with his PCP regarding today's ER visit and for blood pressure recheck as it was elevated in the ER today.  I have discussed return precautions and he agrees and voiced understanding to the above plan and appears reliable.  Final Clinical Impressions(s) / ED Diagnoses   Final diagnoses:  None    ED Discharge Orders    None       Lawrence Marseilles 07/13/18 2120    Gwyneth Sprout, MD 07/15/18 [phone]

## 2018-07-13 NOTE — ED Triage Notes (Signed)
Pt reports constant burning to his penis with discharge. Denies urinary symptoms.

## 2018-07-13 NOTE — Discharge Instructions (Addendum)
Your urine sample was completely normal, no infection.    Please drink plenty of fluids and take ibuprofen 600 mg every 6 hours as needed for pain.  Please see your regular doctor for recheck of your blood pressure which is elevated in the ER today.  Return to the ER if you have any new or concerning symptoms like fever, vomiting, abdominal pain, testicular pain/swelling.

## 2018-11-23 IMAGING — DX DG ANKLE COMPLETE 3+V*R*
3 series · 3 of 3 positions shown · non-contrast
Comparison: None.

CLINICAL DATA: Right ankle pain/swelling x1 month, no known injury

EXAM:
RIGHT ANKLE - COMPLETE 3+ VIEW

[ankle ap]
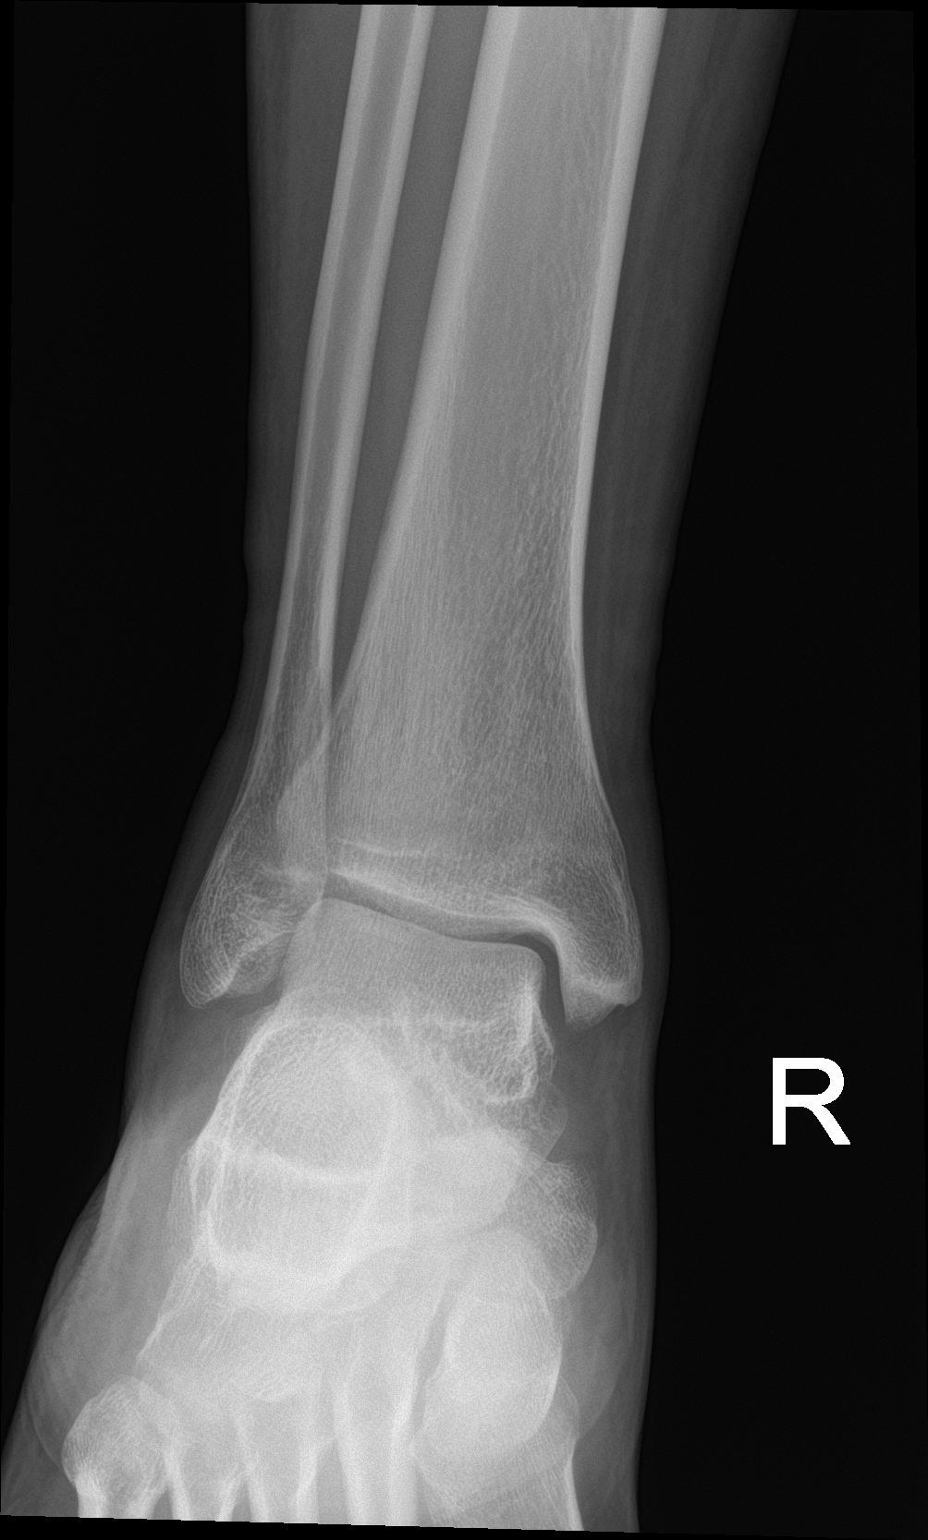

[ankle obl]
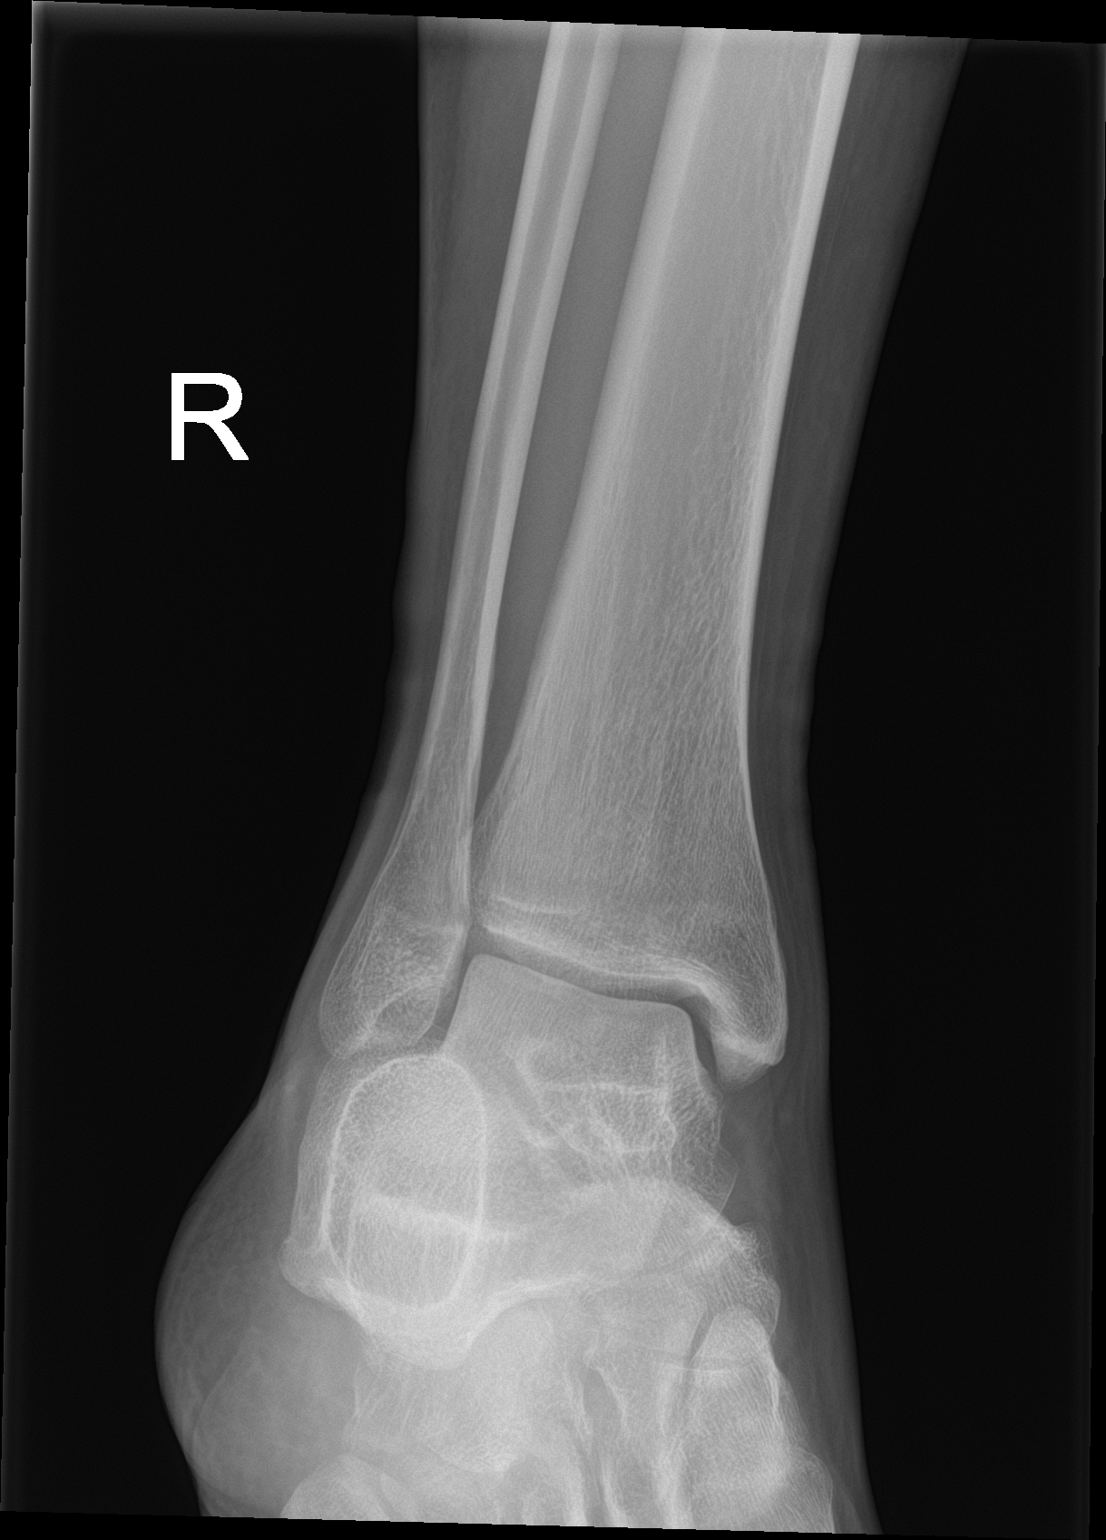

[ankle lat]
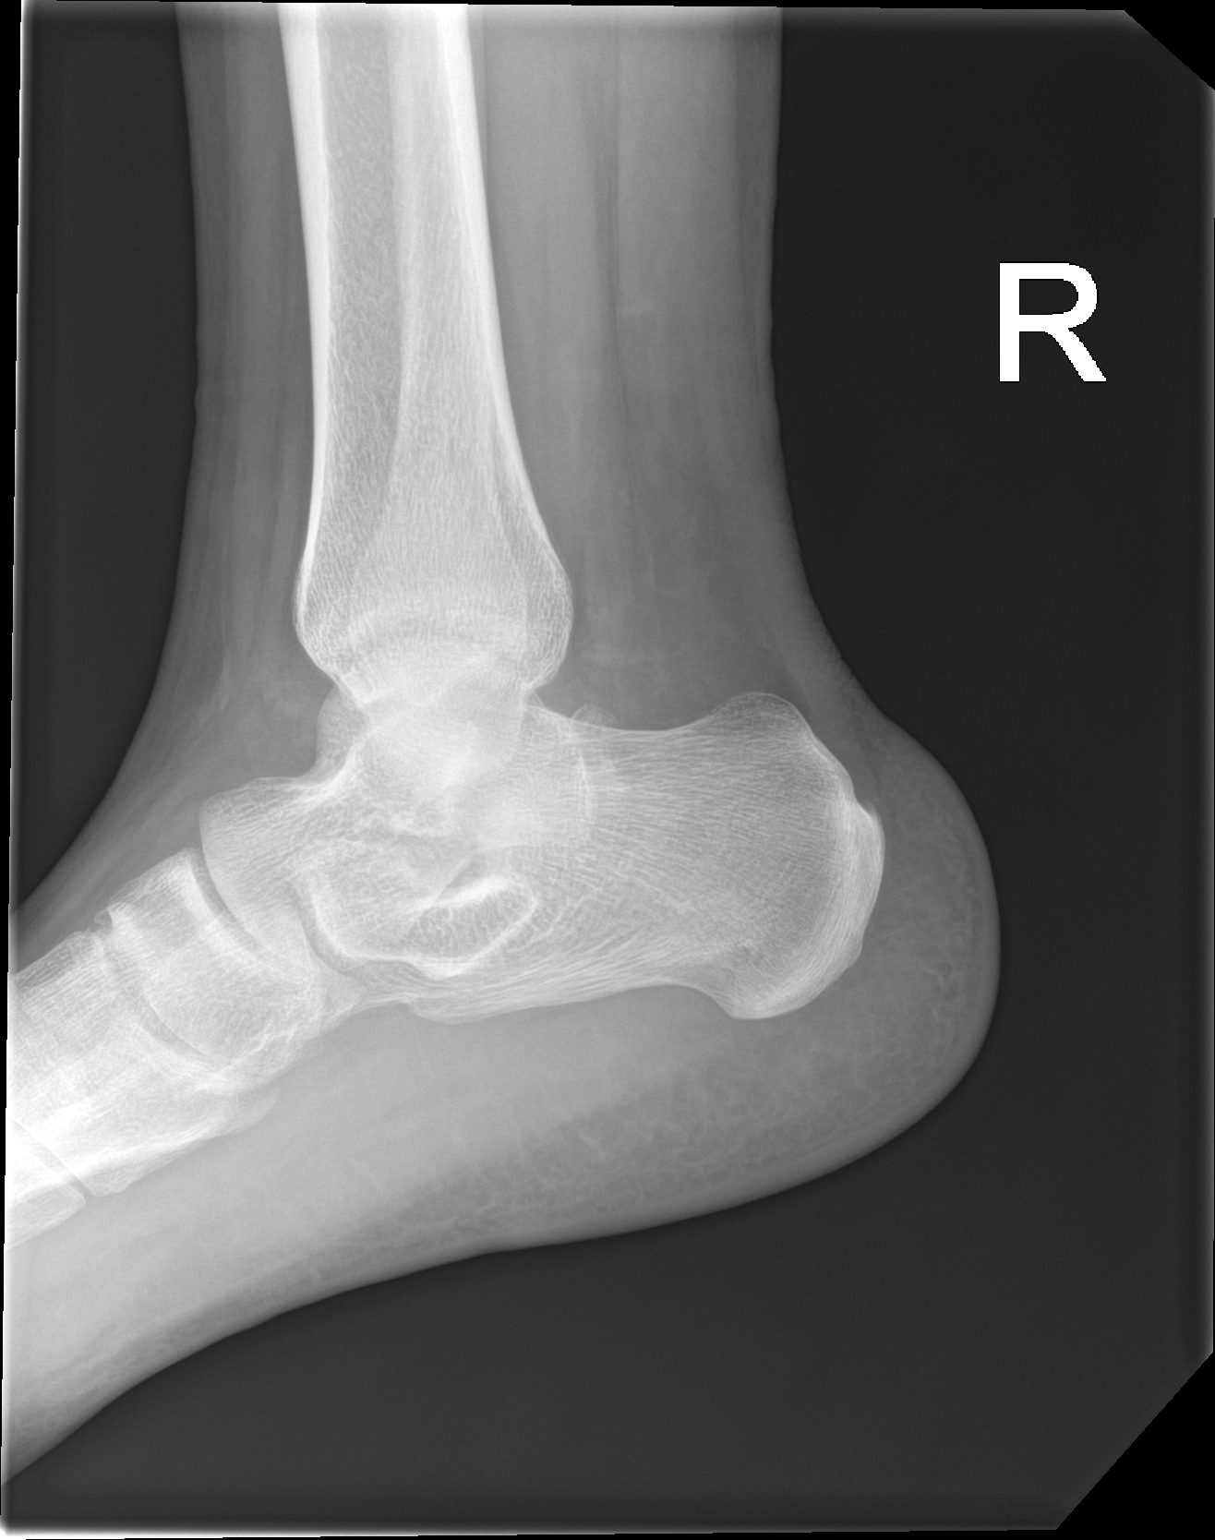

[3 of 3 positions shown; findings below may reference images not displayed]

FINDINGS: No fracture or dislocation is seen.

The ankle mortise is intact.

The base of the fifth metatarsal is unremarkable.
IMPRESSION: Negative.
